# Patient Record
Sex: Male | Born: 1955 | Race: Black or African American | Hispanic: No | Marital: Married | Smoking: Never smoker
Health system: Southern US, Community
[De-identification: ages and names within clinical notes are randomized; demographics above are authoritative.]

## PROBLEM LIST (undated history)

## (undated) DIAGNOSIS — E785 Hyperlipidemia, unspecified: Secondary | ICD-10-CM

## (undated) DIAGNOSIS — G8929 Other chronic pain: Secondary | ICD-10-CM

## (undated) DIAGNOSIS — M549 Dorsalgia, unspecified: Secondary | ICD-10-CM

## (undated) HISTORY — DX: Hyperlipidemia, unspecified: E78.5

## (undated) HISTORY — PX: HERNIA REPAIR: SHX51

---

## 2015-11-15 ENCOUNTER — Ambulatory Visit (INDEPENDENT_AMBULATORY_CARE_PROVIDER_SITE_OTHER): Payer: Medicare HMO | Admitting: Internal Medicine

## 2015-11-15 ENCOUNTER — Encounter: Payer: Self-pay | Admitting: Internal Medicine

## 2015-11-15 VITALS — BP 116/78 | HR 71 | Temp 97.9°F | Ht 71.5 in | Wt 202.0 lb

## 2015-11-15 DIAGNOSIS — E785 Hyperlipidemia, unspecified: Secondary | ICD-10-CM | POA: Diagnosis not present

## 2015-11-15 LAB — LIPID PANEL
CHOL/HDL RATIO: 4
Cholesterol: 266 mg/dL — ABNORMAL HIGH (ref 0–200)
HDL: 65.2 mg/dL (ref >39.00)
LDL CALC: 189 mg/dL — AB (ref 0–99)
NONHDL: 201.15
Triglycerides: 62 mg/dL (ref 0.0–149.0)
VLDL: 12.4 mg/dL (ref 0.0–40.0)

## 2015-11-15 LAB — COMPREHENSIVE METABOLIC PANEL
ALT: 38 U/L (ref 0–53)
AST: 27 U/L (ref 0–37)
Albumin: 3.8 g/dL (ref 3.5–5.2)
Alkaline Phosphatase: 53 U/L (ref 39–117)
BUN: 17 mg/dL (ref 6–23)
CO2: 28 meq/L (ref 19–32)
Calcium: 8.9 mg/dL (ref 8.4–10.5)
Chloride: 103 mEq/L (ref 96–112)
Creatinine, Ser: 0.87 mg/dL (ref 0.40–1.50)
GFR: 116.06 mL/min (ref >60.00)
GLUCOSE: 96 mg/dL (ref 70–99)
POTASSIUM: 3.9 meq/L (ref 3.5–5.1)
Sodium: 135 mEq/L (ref 135–145)
Total Bilirubin: 0.5 mg/dL (ref 0.2–1.2)
Total Protein: 6.6 g/dL (ref 6.0–8.3)

## 2015-11-15 NOTE — Patient Instructions (Signed)

## 2015-11-15 NOTE — Progress Notes (Signed)
Pre visit review using our clinic review tool, if applicable. No additional management support is needed unless otherwise documented below in the visit note. 

## 2015-11-15 NOTE — Progress Notes (Signed)
HPI  Pt presents to the clinic today to establish care and for management of the conditions listed below. He just moved from [place].  HLD: He reports he is taking Zocor as prescribed. It was recently increased. He reports he was also on another cholesterol medication in addition to the Zocor but he can not remember the name. He does take a baby ASA daily. He does try to consume a low fat diet.  History of Stomach Ulcers: Back in the 90's. He reports he takes Advil 600 mg occasionally for back pain. He denies abdominal pain, nausea or dark stools.   Past Medical History  Diagnosis Date  . Hyperlipidemia   . Stomach ulcer     Current Outpatient Prescriptions  Medication Sig Dispense Refill  . ibuprofen (ADVIL,MOTRIN) 600 MG tablet Take 600 mg by mouth as needed.    . simvastatin (ZOCOR) 40 MG tablet Take 40 mg by mouth daily at 6 PM.     . tadalafil (CIALIS) 20 MG tablet Take 20 mg by mouth as needed for erectile dysfunction.     No current facility-administered medications for this visit.    No Known Allergies  Family History  Problem Relation Age of Onset  . COPD Father   . Cancer Paternal Grandfather     Social History   Social History  . Marital Status: Married    Spouse Name: N/A  . Number of Children: N/A  . Years of Education: N/A   Occupational History  . Not on file.   Social History Main Topics  . Smoking status: Never Smoker   . Smokeless tobacco: Never Used  . Alcohol Use: 0.0 oz/week    0 Standard drinks or equivalent per week     Comment: social  . Drug Use: No  . Sexual Activity: Yes   Other Topics Concern  . Not on file   Social History Narrative  . No narrative on file    ROS:  Constitutional: Denies fever, malaise, fatigue, headache or abrupt weight changes.  HEENT: Pt reports blurred vision. Denies eye pain, eye redness, ear pain, ringing in the ears, wax buildup, runny nose, nasal congestion, bloody nose, or sore throat. Respiratory:  Denies difficulty breathing, shortness of breath, cough or sputum production.   Cardiovascular: Denies chest pain, chest tightness, palpitations or swelling in the hands or feet.  Gastrointestinal: Denies abdominal pain, bloating, constipation, diarrhea or blood in the stool.  GU: Denies frequency, urgency, pain with urination, blood in urine, odor or discharge. Musculoskeletal: Pt reports low back pain. Denies decrease in range of motion, difficulty with gait, or joint pain and swelling.  Skin: Denies redness, rashes, lesions or ulcercations.  Neurological: Denies dizziness, difficulty with memory, difficulty with speech or problems with balance and coordination.  Psych: Denies anxiety, depression, SI/HI.  No other specific complaints in a complete review of systems (except as listed in HPI above).  PE:  BP 116/78 mmHg  Pulse 71  Temp(Src) 97.9 F (36.6 C) (Oral)  Ht 5' 11.5" (1.816 m)  Wt 202 lb (91.627 kg)  BMI 27.78 kg/m2  SpO2 97% Wt Readings from Last 3 Encounters:  11/15/15 202 lb (91.627 kg)    General: Appears his stated age, well developed, well nourished in NAD. HEENT: Head: normal shape and size; Eyes: sclera white, no icterus, conjunctiva pink, PERRLA and EOMs intact;  Cardiovascular: Normal rate and rhythm. S1,S2 noted.  No murmur, rubs or gallops noted. No JVD or BLE edema. No carotid bruits noted.  Pulmonary/Chest: Normal effort and positive vesicular breath sounds. No respiratory distress. No wheezes, rales or ronchi noted.  Abdomen: Soft and nontender. Normal bowel sounds. Musculoskeletal: Normal flexion, extension and rotation of the spine. Mild bony tenderness noted over the lumbar spine. Strength 5/5 BUE/BLE. No difficulty with gait.  Neurological: Alert and oriented.  Psychiatric: Mood and affect normal. Behavior is normal. Judgment and thought content normal.    Assessment and Plan:

## 2015-11-15 NOTE — Assessment & Plan Note (Signed)
Encouraged him to consume a low fat diet Will check CMET and Lipid Profile today Continue to consume a baby ASA

## 2015-11-17 ENCOUNTER — Telehealth: Payer: Self-pay | Admitting: Internal Medicine

## 2015-11-17 NOTE — Telephone Encounter (Signed)
Pt returned call re: lab results. Please advise

## 2015-11-17 NOTE — Telephone Encounter (Signed)
Refer to results note

## 2015-11-23 MED ORDER — SIMVASTATIN 40 MG PO TABS: 40.0000 mg | ORAL_TABLET | Freq: Every day | ORAL | Status: DC

## 2015-11-23 NOTE — Addendum Note (Signed)
Addended by: Lurlean Nanny on: 11/23/2015 08:39 AM   Modules accepted: Orders

## 2015-12-13 ENCOUNTER — Encounter (HOSPITAL_COMMUNITY): Payer: Self-pay | Admitting: *Deleted

## 2015-12-13 ENCOUNTER — Telehealth: Payer: Self-pay | Admitting: *Deleted

## 2015-12-13 ENCOUNTER — Telehealth: Payer: Self-pay | Admitting: Internal Medicine

## 2015-12-13 ENCOUNTER — Emergency Department (HOSPITAL_COMMUNITY)
Admission: EM | Admit: 2015-12-13 | Discharge: 2015-12-13 | Disposition: A | Discharge status: Home / Self Care | Payer: Medicare HMO | Attending: Emergency Medicine | Admitting: Emergency Medicine

## 2015-12-13 DIAGNOSIS — E785 Hyperlipidemia, unspecified: Secondary | ICD-10-CM | POA: Insufficient documentation

## 2015-12-13 DIAGNOSIS — M549 Dorsalgia, unspecified: Secondary | ICD-10-CM | POA: Insufficient documentation

## 2015-12-13 DIAGNOSIS — Z79899 Other long term (current) drug therapy: Secondary | ICD-10-CM | POA: Diagnosis not present

## 2015-12-13 DIAGNOSIS — G8929 Other chronic pain: Secondary | ICD-10-CM | POA: Insufficient documentation

## 2015-12-13 HISTORY — DX: Other chronic pain: G89.29

## 2015-12-13 HISTORY — DX: Dorsalgia, unspecified: M54.9

## 2015-12-13 MED ORDER — KETOROLAC TROMETHAMINE 60 MG/2ML IM SOLN
60.0000 mg | Freq: Once | INTRAMUSCULAR | Status: AC
  Administered 2015-12-13: 60 mg via INTRAMUSCULAR
  Filled 2015-12-13: qty 2

## 2015-12-13 MED ORDER — NAPROXEN 500 MG PO TABS: 500.0000 mg | ORAL_TABLET | Freq: Two times a day (BID) | ORAL | Status: DC

## 2015-12-13 MED ORDER — DIAZEPAM 5 MG/ML IJ SOLN
5.0000 mg | Freq: Once | INTRAMUSCULAR | Status: AC
  Administered 2015-12-13: 5 mg via INTRAVENOUS
  Filled 2015-12-13: qty 2

## 2015-12-13 MED ORDER — METHOCARBAMOL 500 MG PO TABS: 500.0000 mg | ORAL_TABLET | Freq: Two times a day (BID) | ORAL | Status: DC
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
## 2015-12-13 NOTE — Telephone Encounter (Signed)
Agree, ER/UC if no appt available today

## 2015-12-13 NOTE — Telephone Encounter (Signed)
Pt called via 3 way to give prior authorization for DME rolling walker.  Call ended abruptly, not sure if insurance company obtained all information.

## 2015-12-13 NOTE — ED Notes (Signed)
Declined W/C at D/C and was escorted to lobby by RN. 
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
## 2015-12-13 NOTE — Discharge Instructions (Signed)
Take the prescribed medication as directed.  This should help with your back spasms. May use wish to use heating pad, warm soaks, etc. To help. Follow-up with your primary care physician if you continue having issues. Return to the ED for new or worsening symptoms.

## 2016-03-08 ENCOUNTER — Other Ambulatory Visit: Payer: Self-pay | Admitting: Internal Medicine

## 2016-03-08 ENCOUNTER — Telehealth: Payer: Self-pay | Admitting: Internal Medicine

## 2016-03-08 MED ORDER — NAPROXEN 500 MG PO TABS: 500.0000 mg | ORAL_TABLET | Freq: Two times a day (BID) | ORAL | Status: DC

## 2016-03-08 MED ORDER — IBUPROFEN 600 MG PO TABS: 600.0000 mg | ORAL_TABLET | ORAL | Status: DC | PRN

## 2016-03-08 MED ORDER — SIMVASTATIN 40 MG PO TABS: 40.0000 mg | ORAL_TABLET | Freq: Every day | ORAL | Status: DC

## 2016-03-08 NOTE — Telephone Encounter (Addendum)
Pt called, concerned that no one had called to check on his cholesterol.  He would like a refill on  simvastatin (ZOCOR) 40 MG tablet [id number] naproxen (NAPROSYN) 500 MG tablet  ibuprofen (ADVIL,MOTRIN) 600 MG tablet [id number]   Scheduled appointment for 03/14/16 for 30 minute follow up.  Pt states he wants to have "everything" checked.  Pt wants the pharmacy changed to [facility] [address] please.

## 2016-03-08 NOTE — Telephone Encounter (Signed)
Medications sent to [facility] per request

## 2016-03-14 ENCOUNTER — Telehealth: Payer: Self-pay | Admitting: Internal Medicine

## 2016-03-14 ENCOUNTER — Encounter: Payer: Medicare HMO | Admitting: Internal Medicine

## 2016-03-14 DIAGNOSIS — Z0289 Encounter for other administrative examinations: Secondary | ICD-10-CM

## 2016-03-14 NOTE — Telephone Encounter (Signed)
Patient did not come for their scheduled appointment today forpt would like followup appointment to check everything   Please let me know if the patient needs to be contacted immediately for follow up or if no follow up is necessary.

## 2016-03-14 NOTE — Telephone Encounter (Signed)
Yes, should be rescheduled as a physical

## 2016-03-15 NOTE — Telephone Encounter (Signed)
Appointment 6/22 Pt aware

## 2016-03-15 NOTE — Telephone Encounter (Signed)
Left message asking pt to call office  °

## 2016-04-06 ENCOUNTER — Other Ambulatory Visit: Payer: Self-pay | Admitting: Internal Medicine

## 2016-04-06 ENCOUNTER — Ambulatory Visit (INDEPENDENT_AMBULATORY_CARE_PROVIDER_SITE_OTHER): Payer: Medicare HMO | Admitting: Internal Medicine

## 2016-04-06 ENCOUNTER — Encounter: Payer: Self-pay | Admitting: Internal Medicine

## 2016-04-06 VITALS — BP 122/84 | HR 76 | Temp 98.2°F | Ht 72.0 in | Wt 198.0 lb

## 2016-04-06 DIAGNOSIS — M549 Dorsalgia, unspecified: Secondary | ICD-10-CM

## 2016-04-06 DIAGNOSIS — Z1159 Encounter for screening for other viral diseases: Secondary | ICD-10-CM | POA: Diagnosis not present

## 2016-04-06 DIAGNOSIS — H6983 Other specified disorders of Eustachian tube, bilateral: Secondary | ICD-10-CM | POA: Diagnosis not present

## 2016-04-06 DIAGNOSIS — Z0000 Encounter for general adult medical examination without abnormal findings: Secondary | ICD-10-CM

## 2016-04-06 DIAGNOSIS — G8929 Other chronic pain: Secondary | ICD-10-CM | POA: Insufficient documentation

## 2016-04-06 LAB — CBC
HEMATOCRIT: 39.2 % (ref 39.0–52.0)
Hemoglobin: 12.6 g/dL — ABNORMAL LOW (ref 13.0–17.0)
MCHC: 32.2 g/dL (ref 30.0–36.0)
MCV: 86.4 fl (ref 78.0–100.0)
Platelets: 212 10*3/uL (ref 150.0–400.0)
RBC: 4.54 Mil/uL (ref 4.22–5.81)
RDW: 14.3 % (ref 11.5–15.5)
WBC: 3.7 10*3/uL — ABNORMAL LOW (ref 4.0–10.5)

## 2016-04-06 LAB — LIPID PANEL
CHOLESTEROL: 184 mg/dL (ref 0–200)
HDL: 55.8 mg/dL (ref >39.00)
LDL CALC: 119 mg/dL — AB (ref 0–99)
NONHDL: 128.57
Total CHOL/HDL Ratio: 3
Triglycerides: 47 mg/dL (ref 0.0–149.0)
VLDL: 9.4 mg/dL (ref 0.0–40.0)

## 2016-04-06 LAB — COMPREHENSIVE METABOLIC PANEL
ALT: 38 U/L (ref 0–53)
AST: 38 U/L — ABNORMAL HIGH (ref 0–37)
Albumin: 3.8 g/dL (ref 3.5–5.2)
Alkaline Phosphatase: 48 U/L (ref 39–117)
BUN: 12 mg/dL (ref 6–23)
CHLORIDE: 103 meq/L (ref 96–112)
CO2: 32 mEq/L (ref 19–32)
Calcium: 8.9 mg/dL (ref 8.4–10.5)
Creatinine, Ser: 0.88 mg/dL (ref 0.40–1.50)
GFR: 114.38 mL/min (ref >60.00)
GLUCOSE: 94 mg/dL (ref 70–99)
POTASSIUM: 4 meq/L (ref 3.5–5.1)
SODIUM: 136 meq/L (ref 135–145)
Total Bilirubin: 0.4 mg/dL (ref 0.2–1.2)
Total Protein: 6.2 g/dL (ref 6.0–8.3)

## 2016-04-06 NOTE — Progress Notes (Signed)
Pre visit review using our clinic review tool, if applicable. No additional management support is needed unless otherwise documented below in the visit note. 

## 2016-04-06 NOTE — Patient Instructions (Signed)

## 2016-04-06 NOTE — Progress Notes (Signed)
HPI:  Chris Allison presents to the clinic today for his Medicare Wellness Exam.  Past Medical History  Diagnosis Date  . Hyperlipidemia   . Chronic back pain     Current Outpatient Prescriptions  Medication Sig Dispense Refill  . ibuprofen (ADVIL,MOTRIN) 600 MG tablet Take 1 tablet (600 mg total) by mouth as needed. 30 tablet 2  . methocarbamol (ROBAXIN) 500 MG tablet Take 1 tablet (500 mg total) by mouth 2 (two) times daily. 20 tablet 0  . naproxen (NAPROSYN) 500 MG tablet Take 1 tablet (500 mg total) by mouth 2 (two) times daily with a meal. 30 tablet 2  . simvastatin (ZOCOR) 40 MG tablet Take 1 tablet (40 mg total) by mouth daily at 6 PM. 30 tablet 0  . tadalafil (CIALIS) 20 MG tablet Take 20 mg by mouth as needed for erectile dysfunction.     No current facility-administered medications for this visit.    No Known Allergies  Family History  Problem Relation Age of Onset  . COPD Father   . Cancer Paternal Grandfather     Social History   Social History  . Marital Status: Married    Spouse Name: N/A  . Number of Children: N/A  . Years of Education: N/A   Occupational History  . Not on file.   Social History Main Topics  . Smoking status: Never Smoker   . Smokeless tobacco: Never Used  . Alcohol Use: 0.0 oz/week    0 Standard drinks or equivalent per week     Comment: social  . Drug Use: No  . Sexual Activity: Yes   Other Topics Concern  . Not on file   Social History Narrative    Hospitiliaztions: None  Health Maintenance:    Flu: 06/2015  Tetanus: 11/2012  PSA: 1 year ago  Colon Screening: 2012  Eye Doctor: as needed  Dental Exam: biannually   Providers:   PCP: Webb Silversmith, NP-C    I have personally reviewed and have noted:  1. The patient's medical and social history 2. Their use of alcohol, tobacco or illicit drugs 3. Their current medications and supplements 4. The patient's functional ability including ADL's, fall risks, home safety  risks and  hearing or visual impairment. 5. Diet and physical activities 6. Evidence for depression or mood disorder  Subjective:   Review of Systems:   Constitutional: Denies fever, malaise, fatigue, headache or abrupt weight changes.  HEENT: Chris Allison reports bilateral ear fullness. Denies eye pain, eye redness, ear pain, ringing in the ears, wax buildup, runny nose, nasal congestion, bloody nose, or sore throat. Respiratory: Denies difficulty breathing, shortness of breath, cough or sputum production.   Cardiovascular: Denies chest pain, chest tightness, palpitations or swelling in the hands or feet.  Gastrointestinal: Denies abdominal pain, bloating, constipation, diarrhea or blood in the stool.  GU: Denies urgency, frequency, pain with urination, burning sensation, blood in urine, odor or discharge. Musculoskeletal: Denies decrease in range of motion, difficulty with gait, muscle pain or joint pain and swelling.  Skin: Denies redness, rashes, lesions or ulcercations.  Neurological: Denies dizziness, difficulty with memory, difficulty with speech or problems with balance and coordination.  Psych: Denies anxiety, depression, SI/HI.  No other specific complaints in a complete review of systems (except as listed in HPI above).  Objective:  PE:  BP 122/84 mmHg  Pulse 76  Temp(Src) 98.2 F (36.8 C) (Oral)  Ht 6' (1.829 m)  Wt 198 lb (89.812 kg)  BMI 26.85 kg/m2  SpO2 98%  Wt Readings from Last 3 Encounters:  04/06/16 198 lb (89.812 kg)  12/13/15 200 lb (90.719 kg)  11/15/15 202 lb (91.627 kg)    General: Appears his stated age, well developed, well nourished in NAD. Skin: Warm, dry and intact. Sebaceous cyst noted to right chin. HEENT: Head: normal shape and size; Eyes: sclera white, no icterus, conjunctiva pink, PERRLA and EOMs intact; Ears: Tm's gray and intact, normal light reflex, + serous effusion bilaterally; Throat/Mouth: Teeth present, mucosa pink and moist, no exudate, lesions or  ulcerations noted.  Neck: Neck supple, trachea midline. No masses, lumps or thyromegaly present.  Cardiovascular: Normal rate and rhythm. S1,S2 noted.  No murmur, rubs or gallops noted. No JVD or BLE edema. No carotid bruits noted. Pulmonary/Chest: Normal effort and positive vesicular breath sounds. No respiratory distress. No wheezes, rales or ronchi noted.  Abdomen: Soft and nontender. Normal bowel sounds. No distention or masses noted. Liver, spleen and kidneys non palpable. Musculoskeletal: Strength 5/5 BUE/BLE. No signs of joint swelling.  Neurological: Alert and oriented. Cranial nerves II-XII grossly intact. Coordination normal.  Psychiatric: Mood and affect normal. Behavior is normal. Judgment and thought content normal.     BMET    Component Value Date/Time   NA 135 11/15/2015 1135   K 3.9 11/15/2015 1135   CL 103 11/15/2015 1135   CO2 28 11/15/2015 1135   GLUCOSE 96 11/15/2015 1135   BUN 17 11/15/2015 1135   CREATININE 0.87 11/15/2015 1135   CALCIUM 8.9 11/15/2015 1135    Lipid Panel     Component Value Date/Time   CHOL 266* 11/15/2015 1135   TRIG 62.0 11/15/2015 1135   HDL 65.20 11/15/2015 1135   CHOLHDL 4 11/15/2015 1135   VLDL 12.4 11/15/2015 1135   LDLCALC 189* 11/15/2015 1135    CBC No results found for: WBC, RBC, HGB, HCT, PLT, MCV, MCH, MCHC, RDW, LYMPHSABS, MONOABS, EOSABS, BASOSABS  Hgb A1C No results found for: HGBA1C    Assessment and Plan:   Medicare Annual Wellness Visit:  Diet: He does eat meat. He eats veggies daily, fruits not all. He does consume fried foods. He drinks a lot of water. Physical activity: Mowing 0.5 acres 2 days week Depression/mood screen: Negative Hearing: Intact to whispered voice Visual acuity: Grossly normal, he is scheduling an eye exam ADLs: Capable Fall risk: None Home safety: Good Cognitive evaluation: Intact to orientation, naming, recall and repetition EOL planning: No adv directives, full code/ I  agree  Preventative Medicine: Flu and tetanus UTD, Encouraged him to see and eye doctor and dentist at least annually. Colonoscopy due in 5 years.   Next appointment: 1 year annual exam  ETC bilateral:  Try Flonase OTC

## 2016-04-06 NOTE — Addendum Note (Signed)
Addended by: Marchia Bond on: 04/06/2016 04:29 PM   Modules accepted: Miquel Dunn

## 2016-04-07 LAB — HEPATITIS C ANTIBODY: HCV Ab: NEGATIVE

## 2016-06-16 ENCOUNTER — Other Ambulatory Visit: Payer: Self-pay

## 2016-06-16 MED ORDER — SIMVASTATIN 40 MG PO TABS: 40.0000 mg | ORAL_TABLET | Freq: Every day | ORAL | 11 refills | Status: DC

## 2016-08-18 ENCOUNTER — Telehealth: Payer: Self-pay | Admitting: Internal Medicine

## 2016-08-18 ENCOUNTER — Ambulatory Visit: Payer: Medicare HMO | Admitting: Internal Medicine

## 2016-08-18 DIAGNOSIS — Z0289 Encounter for other administrative examinations: Secondary | ICD-10-CM

## 2016-08-18 NOTE — Telephone Encounter (Signed)
No follow up needed

## 2016-08-18 NOTE — Telephone Encounter (Signed)
Patient did not come in for their appointment today for possible kidney pain Please let me know if patient needs to be contacted immediately for follow up or no follow up needed.

## 2016-10-20 ENCOUNTER — Other Ambulatory Visit: Payer: Self-pay | Admitting: Internal Medicine

## 2017-02-27 ENCOUNTER — Other Ambulatory Visit: Payer: Self-pay | Admitting: Internal Medicine

## 2017-04-13 ENCOUNTER — Encounter: Payer: Self-pay | Admitting: Internal Medicine

## 2017-04-13 ENCOUNTER — Ambulatory Visit (INDEPENDENT_AMBULATORY_CARE_PROVIDER_SITE_OTHER): Payer: Medicare HMO | Admitting: Internal Medicine

## 2017-04-13 VITALS — BP 122/74 | HR 61 | Temp 98.2°F | Ht 71.33 in | Wt 196.5 lb

## 2017-04-13 DIAGNOSIS — Z125 Encounter for screening for malignant neoplasm of prostate: Secondary | ICD-10-CM | POA: Diagnosis not present

## 2017-04-13 DIAGNOSIS — M545 Low back pain: Secondary | ICD-10-CM

## 2017-04-13 DIAGNOSIS — G8929 Other chronic pain: Secondary | ICD-10-CM | POA: Diagnosis not present

## 2017-04-13 DIAGNOSIS — Z0000 Encounter for general adult medical examination without abnormal findings: Secondary | ICD-10-CM | POA: Diagnosis not present

## 2017-04-13 DIAGNOSIS — N3943 Post-void dribbling: Secondary | ICD-10-CM

## 2017-04-13 DIAGNOSIS — E7800 Pure hypercholesterolemia, unspecified: Secondary | ICD-10-CM | POA: Diagnosis not present

## 2017-04-13 DIAGNOSIS — N529 Male erectile dysfunction, unspecified: Secondary | ICD-10-CM | POA: Diagnosis not present

## 2017-04-13 DIAGNOSIS — N401 Enlarged prostate with lower urinary tract symptoms: Secondary | ICD-10-CM

## 2017-04-13 DIAGNOSIS — N400 Enlarged prostate without lower urinary tract symptoms: Secondary | ICD-10-CM | POA: Insufficient documentation

## 2017-04-13 LAB — TSH: TSH: 1.53 u[IU]/mL (ref 0.35–4.50)

## 2017-04-13 LAB — LIPID PANEL
Cholesterol: 224 mg/dL — ABNORMAL HIGH (ref 0–200)
HDL: 65.1 mg/dL (ref >39.00)
LDL CALC: 148 mg/dL — AB (ref 0–99)
NONHDL: 158.83
Total CHOL/HDL Ratio: 3
Triglycerides: 56 mg/dL (ref 0.0–149.0)
VLDL: 11.2 mg/dL (ref 0.0–40.0)

## 2017-04-13 LAB — COMPREHENSIVE METABOLIC PANEL
ALT: 33 U/L (ref 0–53)
AST: 34 U/L (ref 0–37)
Albumin: 4.1 g/dL (ref 3.5–5.2)
Alkaline Phosphatase: 41 U/L (ref 39–117)
BUN: 16 mg/dL (ref 6–23)
CHLORIDE: 105 meq/L (ref 96–112)
CO2: 33 mEq/L — ABNORMAL HIGH (ref 19–32)
CREATININE: 0.92 mg/dL (ref 0.40–1.50)
Calcium: 9.4 mg/dL (ref 8.4–10.5)
GFR: 108.28 mL/min (ref >60.00)
GLUCOSE: 100 mg/dL — AB (ref 70–99)
Potassium: 4.6 mEq/L (ref 3.5–5.1)
SODIUM: 140 meq/L (ref 135–145)
Total Bilirubin: 0.5 mg/dL (ref 0.2–1.2)
Total Protein: 6.6 g/dL (ref 6.0–8.3)

## 2017-04-13 LAB — CBC
HEMATOCRIT: 42 % (ref 39.0–52.0)
Hemoglobin: 13.6 g/dL (ref 13.0–17.0)
MCHC: 32.3 g/dL (ref 30.0–36.0)
MCV: 87.7 fl (ref 78.0–100.0)
Platelets: 206 10*3/uL (ref 150.0–400.0)
RBC: 4.79 Mil/uL (ref 4.22–5.81)
RDW: 13.8 % (ref 11.5–15.5)
WBC: 4.3 10*3/uL (ref 4.0–10.5)

## 2017-04-13 LAB — PSA, MEDICARE: PSA: 0.3 ng/mL (ref 0.10–4.00)

## 2017-04-13 NOTE — Assessment & Plan Note (Signed)
Encouraged him to stay active and keep his core strong Continue Advil, Naproxen and Robaxin prn

## 2017-04-13 NOTE — Assessment & Plan Note (Signed)
PSA today He declines RX for Flomax at this time

## 2017-04-13 NOTE — Assessment & Plan Note (Signed)
He questioned having his testosterone checked but declined after discussing risk of TRT Continue Cialis for now

## 2017-04-13 NOTE — Assessment & Plan Note (Signed)
Encouraged him to consume a low fat diet Continue Lipitor for now CMET and Lipid profile today

## 2017-04-13 NOTE — Patient Instructions (Signed)

## 2017-04-13 NOTE — Progress Notes (Signed)
HPI:  Pt presents to the clinic today for his Medicare Wellness Exam. He is also due to follow up chronic conditions.  Chronic Back Pain: There are no xrays or MRI's in Epic for reviewed. He reports his back pain will flare up intermittently and he takes Naproxen and Robaxin as needed with good relief.  HLD: His last LDL was 119, 03/2016. He is taking Zocor daily as prescribed. He denies myalgias. He does not consume a low fat diet.  Erectile Dysfunction: He reports low desire and thinks his testosterone may be low. He takes Cialis as needed to help with fullness of his erections.  He reports feeling "hungover" in the mornings. This occurs intermittently and usually last for 30 minutes before resolving. He denies headache, dizziness, lightheadedness or nausea. He is not sure what triggers it. He does not drink that often. He averages about 6 hours of sleep at night. He intermittently has daytime fatigue, but he does not take naps. He denies stress, anxiety or depression.  Past Medical History:  Diagnosis Date  . Chronic back pain   . Hyperlipidemia     Current Outpatient Prescriptions  Medication Sig Dispense Refill  . ibuprofen (ADVIL,MOTRIN) 600 MG tablet TAKE ONE TABLET BY MOUTH AS NEEDED 90 tablet 0  . methocarbamol (ROBAXIN) 500 MG tablet Take 1 tablet (500 mg total) by mouth 2 (two) times daily. 20 tablet 0  . naproxen (NAPROSYN) 500 MG tablet Take 1 tablet (500 mg total) by mouth 2 (two) times daily with a meal. 30 tablet 2  . simvastatin (ZOCOR) 40 MG tablet Take 1 tablet (40 mg total) by mouth daily at 6 PM. 30 tablet 11  . tadalafil (CIALIS) 20 MG tablet Take 20 mg by mouth as needed for erectile dysfunction.     No current facility-administered medications for this visit.     No Known Allergies  Family History  Problem Relation Age of Onset  . COPD Father   . Cancer Paternal Grandfather     Social History   Social History  . Marital status: Married    Spouse name:  N/A  . Number of children: N/A  . Years of education: N/A   Occupational History  . Not on file.   Social History Main Topics  . Smoking status: Never Smoker  . Smokeless tobacco: Never Used  . Alcohol use 0.0 oz/week     Comment: social  . Drug use: No  . Sexual activity: Yes   Other Topics Concern  . Not on file   Social History Narrative  . No narrative on file    Hospitiliaztions: None  Health Maintenance:    Flu: 06/2016  Tetanus: 11/2012  PSA: unsure  Colon Screening: 2012  Eye Doctor: as needed  Dental Exam: biannually   Providers:   PCP: Webb Silversmith, NP-C   I have personally reviewed and have noted:  1. The patient's medical and social history 2. Their use of alcohol, tobacco or illicit drugs 3. Their current medications and supplements 4. The patient's functional ability including ADL's, fall risks, home safety risks and hearing or visual impairment. 5. Diet and physical activities 6. Evidence for depression or mood disorder  Subjective:   Review of Systems:   Constitutional: Denies fever, malaise, fatigue, headache or abrupt weight changes.  HEENT: Denies eye pain, eye redness, ear pain, ringing in the ears, wax buildup, runny nose, nasal congestion, bloody nose, or sore throat. Respiratory: Denies difficulty breathing, shortness of breath, cough or sputum  production.   Cardiovascular: Denies chest pain, chest tightness, palpitations or swelling in the hands or feet.  Gastrointestinal: Denies abdominal pain, bloating, constipation, diarrhea or blood in the stool.  GU: Pt reports erectile dysfunction and low desire. Denies urgency, frequency, pain with urination, burning sensation, blood in urine, odor or discharge. Musculoskeletal: Pt reports intermittent low back pain. Denies decrease in range of motion, difficulty with gait, or joint swelling.  Skin: Pt concerned about mole on back. Denies redness, rashes, or ulcercations.  Neurological: Denies  dizziness, difficulty with memory, difficulty with speech or problems with balance and coordination.  Psych: Denies anxiety, depression, SI/HI.  No other specific complaints in a complete review of systems (except as listed in HPI above).  Objective:  PE:   BP 122/74   Pulse 61   Temp 98.2 F (36.8 C) (Oral)   Ht 5' 11.33" (1.812 m)   Wt 196 lb 8 oz (89.1 kg)   SpO2 98%   BMI 27.15 kg/m   Wt Readings from Last 3 Encounters:  04/06/16 198 lb (89.8 kg)  12/13/15 200 lb (90.7 kg)  11/15/15 202 lb (91.6 kg)    General: Appears his stated age, well developed, well nourished in NAD. Skin: Warm, dry and intact. Mole,  < 1 cm, irregularly shaped but uniform in color and consistency noted on midline upper back. Sebaceous cyst noted on back. HEENT: Head: normal shape and size; Eyes: sclera white, no icterus, conjunctiva pink, PERRLA and EOMs intact; Ears: Tm's gray and intact, normal light reflex; Throat/Mouth: Teeth present, mucosa pink and moist, + PND, no exudate, lesions or ulcerations noted.  Neck: Neck supple, trachea midline. No masses, lumps or thyromegaly present.  Cardiovascular: Normal rate and rhythm. S1,S2 noted.  No murmur, rubs or gallops noted. No JVD or BLE edema. No carotid bruits noted. Pulmonary/Chest: Normal effort and positive vesicular breath sounds. No respiratory distress. No wheezes, rales or ronchi noted.  Abdomen: Soft and nontender. Normal bowel sounds. No distention or masses noted. Liver, spleen and kidneys non palpable. Rectal: Normal tone, slightly enlarged prostate noted. Musculoskeletal: Strength 5/5 BUE/BLE. No signs of joint swelling.  Neurological: Alert and oriented. Cranial nerves II-XII grossly intact. No difficulty with gait. Psychiatric: Mood and affect normal. Behavior is normal. Judgment and thought content normal.    BMET    Component Value Date/Time   NA 136 04/06/2016 1504   K 4.0 04/06/2016 1504   CL 103 04/06/2016 1504   CO2 32  04/06/2016 1504   GLUCOSE 94 04/06/2016 1504   BUN 12 04/06/2016 1504   CREATININE 0.88 04/06/2016 1504   CALCIUM 8.9 04/06/2016 1504    Lipid Panel     Component Value Date/Time   CHOL 184 04/06/2016 1504   TRIG 47.0 04/06/2016 1504   HDL 55.80 04/06/2016 1504   CHOLHDL 3 04/06/2016 1504   VLDL 9.4 04/06/2016 1504   LDLCALC 119 (H) 04/06/2016 1504    CBC    Component Value Date/Time   WBC 3.7 (L) 04/06/2016 1504   RBC 4.54 04/06/2016 1504   HGB 12.6 (L) 04/06/2016 1504   HCT 39.2 04/06/2016 1504   PLT 212.0 04/06/2016 1504   MCV 86.4 04/06/2016 1504   MCHC 32.2 04/06/2016 1504   RDW 14.3 04/06/2016 1504    Hgb A1C No results found for: HGBA1C    Assessment and Plan:   Medicare Annual Wellness Visit:  Diet: He does eat meat. He consumes fruits and veggies daily. He does occasionally eat fried  foods. He drinks mostly water. Physical activity: He gets regular activity about 3 days a week. Depression/mood screen: Negative Hearing: Intact to whispered voice Visual acuity: Grossly normal, having some issues reading, wears readers ADLs: Capable Fall risk: None Home safety: Good Cognitive evaluation: Intact to orientation, naming, recall and repetition EOL planning: No adv directives, full code/ I agree  Preventative Medicine: Encouraged him to get a flu shot in the fall. Tetanus UTD. Colon screening UTD per his report but will request record. Encouraged him to consume a balanced diet and exercise regimen. Advised him to see an eye doctor and dentist at least annually. Will check CBC, CMET, Lipid, TSH, and PSA today.   Next appointment: 1 year, Medicare Wellness Exam   Webb Silversmith, NP

## 2017-04-16 ENCOUNTER — Telehealth: Payer: Self-pay | Admitting: Internal Medicine

## 2017-04-16 MED ORDER — SIMVASTATIN 80 MG PO TABS: 80.0000 mg | ORAL_TABLET | Freq: Every day | ORAL | 3 refills | Status: DC

## 2017-04-16 NOTE — Telephone Encounter (Signed)
Patient returned Melanie's call.  Patient said he is taking his cholesterol medication 90% of the time.  It's okay to increase the dose.  Patient uses [facility].  Patient said Threasa Beards can call him back tomorrow morning.

## 2017-04-16 NOTE — Telephone Encounter (Signed)
Rx sent to pharmacy   

## 2017-04-16 NOTE — Addendum Note (Signed)
Addended by: Lurlean Nanny on: 04/16/2017 04:18 PM   Modules accepted: Orders

## 2017-06-25 DIAGNOSIS — F330 Major depressive disorder, recurrent, mild: Secondary | ICD-10-CM | POA: Diagnosis not present

## 2017-07-05 DIAGNOSIS — F330 Major depressive disorder, recurrent, mild: Secondary | ICD-10-CM | POA: Diagnosis not present

## 2017-07-20 DIAGNOSIS — F330 Major depressive disorder, recurrent, mild: Secondary | ICD-10-CM | POA: Diagnosis not present

## 2017-08-09 ENCOUNTER — Other Ambulatory Visit: Payer: Self-pay | Admitting: Internal Medicine

## 2017-08-09 NOTE — Telephone Encounter (Signed)
Last refill 02/27/17 #90  Last OV 04/13/17  Ok to refill?

## 2017-10-15 ENCOUNTER — Ambulatory Visit: Payer: Self-pay | Admitting: *Deleted

## 2017-10-15 NOTE — Telephone Encounter (Signed)
Called in c/o right sided abd pain from under ribs to hip.  It really hurts bad when I lay on my right side.   It doesn't hurt while I'm up and moving around.   He requested his appt be made on Friday due to his work schedule.   I instructed him to go to the urgent care or ED if his pain became worse over the holiday (New Year's; the office is closed).    He verbalized understanding.  Reason for Disposition . [1] MODERATE pain (e.g., interferes with normal activities) AND [2] pain comes and goes (cramps) AND [3] present > 24 hours  (Exception: pain with Vomiting or Diarrhea - see that Guideline)  Answer Assessment - Initial Assessment Questions 1. LOCATION: "Where does it hurt?"      Hurting on right side under my ribs for a couple of weeks now. 2. RADIATION: "Does the pain shoot anywhere else?" (e.g., chest, back)     It's not going anywhere else. 3. ONSET: "When did the pain begin?" (Minutes, hours or days ago)      Couple of weeks now 4. SUDDEN: "Gradual or sudden onset?"  Gradually then became worse.     Constant ache.  When I'm up it doesn't hurt at all but when I lay down on my right side it hurts really bad. 5. PATTERN "Does the pain come and go, or is it constant?"    - If constant: "Is it getting better, staying the same, or worsening?"      (Note: Constant means the pain never goes away completely; most serious pain is constant and it progresses)     - If intermittent: "How long does it last?" "Do you have pain now?"     (Note: Intermittent means the pain goes away completely between bouts)     Getting worse with time. 6. SEVERITY: "How bad is the pain?"  (e.g., Scale 1-10; mild, moderate, or severe)    - MILD (1-3): doesn't interfere with normal activities, abdomen soft and not tender to touch     - MODERATE (4-7): interferes with normal activities or awakens from sleep, tender to touch     - SEVERE (8-10): excruciating pain, doubled over, unable to do any normal activities    10 when laying on my right side. 7. RECURRENT SYMPTOM: "Have you ever had this type of abdominal pain before?" If so, ask: "When was the last time?" and "What happened that time?"      I took some Ibuprofen and it helps a lot.   8. CAUSE: "What do you think is causing the abdominal pain?"     I don't know.   Feels like a really bad pulled muscle. 9. RELIEVING/AGGRAVATING FACTORS: "What makes it better or worse?" (e.g., movement, antacids, bowel movement)     No 10. OTHER SYMPTOMS: "Has there been any vomiting, diarrhea, constipation, or urine problems?"       No  Protocols used: ABDOMINAL PAIN - MALE-A-AH

## 2017-10-19 ENCOUNTER — Ambulatory Visit: Payer: Medicare HMO | Admitting: Internal Medicine

## 2017-10-19 DIAGNOSIS — Z0289 Encounter for other administrative examinations: Secondary | ICD-10-CM

## 2017-11-09 ENCOUNTER — Ambulatory Visit (INDEPENDENT_AMBULATORY_CARE_PROVIDER_SITE_OTHER): Payer: Medicare HMO | Admitting: Internal Medicine

## 2017-11-09 ENCOUNTER — Ambulatory Visit (INDEPENDENT_AMBULATORY_CARE_PROVIDER_SITE_OTHER)
Admission: RE | Admit: 2017-11-09 | Discharge: 2017-11-09 | Disposition: A | Discharge status: Home / Self Care | Payer: Medicare HMO | Source: Ambulatory Visit | Attending: Internal Medicine | Admitting: Internal Medicine

## 2017-11-09 ENCOUNTER — Encounter: Payer: Self-pay | Admitting: Internal Medicine

## 2017-11-09 VITALS — BP 120/82 | HR 102 | Temp 98.2°F | Wt 201.0 lb

## 2017-11-09 DIAGNOSIS — R0781 Pleurodynia: Secondary | ICD-10-CM

## 2017-11-09 DIAGNOSIS — R6882 Decreased libido: Secondary | ICD-10-CM

## 2017-11-09 DIAGNOSIS — M25561 Pain in right knee: Secondary | ICD-10-CM | POA: Diagnosis not present

## 2017-11-09 DIAGNOSIS — R1011 Right upper quadrant pain: Secondary | ICD-10-CM

## 2017-11-09 LAB — COMPREHENSIVE METABOLIC PANEL
ALBUMIN: 3.8 g/dL (ref 3.5–5.2)
ALT: 60 U/L — ABNORMAL HIGH (ref 0–53)
AST: 66 U/L — ABNORMAL HIGH (ref 0–37)
Alkaline Phosphatase: 51 U/L (ref 39–117)
BUN: 17 mg/dL (ref 6–23)
CALCIUM: 9 mg/dL (ref 8.4–10.5)
CHLORIDE: 103 meq/L (ref 96–112)
CO2: 30 mEq/L (ref 19–32)
CREATININE: 0.85 mg/dL (ref 0.40–1.50)
GFR: 118.4 mL/min (ref >60.00)
Glucose, Bld: 112 mg/dL — ABNORMAL HIGH (ref 70–99)
POTASSIUM: 4 meq/L (ref 3.5–5.1)
Sodium: 136 mEq/L (ref 135–145)
Total Bilirubin: 0.5 mg/dL (ref 0.2–1.2)
Total Protein: 6.7 g/dL (ref 6.0–8.3)

## 2017-11-09 LAB — CBC
HEMATOCRIT: 41.3 % (ref 39.0–52.0)
HEMOGLOBIN: 13.6 g/dL (ref 13.0–17.0)
MCHC: 32.9 g/dL (ref 30.0–36.0)
MCV: 86.4 fl (ref 78.0–100.0)
PLATELETS: 194 10*3/uL (ref 150.0–400.0)
RBC: 4.78 Mil/uL (ref 4.22–5.81)
RDW: 13.7 % (ref 11.5–15.5)
WBC: 3.5 10*3/uL — AB (ref 4.0–10.5)

## 2017-11-09 NOTE — Patient Instructions (Signed)
Chest Wall Pain °Chest wall pain is pain in or around the bones and muscles of your chest. Sometimes, an injury causes this pain. Sometimes, the cause may not be known. This pain may take several weeks or longer to get better. °Follow these instructions at home: °Pay attention to any changes in your symptoms. Take these actions to help with your pain: °· Rest as told by your doctor. °· Avoid activities that cause pain. Try not to use your chest, belly (abdominal), or side muscles to lift heavy things. °· If directed, apply ice to the painful area: °? Put ice in a plastic bag. °? Place a towel between your skin and the bag. °? Leave the ice on for 20 minutes, 2-3 times per day. °· Take over-the-counter and prescription medicines only as told by your doctor. °· Do not use tobacco products, including cigarettes, chewing tobacco, and e-cigarettes. If you need help quitting, ask your doctor. °· Keep all follow-up visits as told by your doctor. This is important. ° °Contact a doctor if: °· You have a fever. °· Your chest pain gets worse. °· You have new symptoms. °Get help right away if: °· You feel sick to your stomach (nauseous) or you throw up (vomit). °· You feel sweaty or light-headed. °· You have a cough with phlegm (sputum) or you cough up blood. °· You are short of breath. °This information is not intended to replace advice given to you by your health care provider. Make sure you discuss any questions you have with your health care provider. °Document Released: 03/20/2008 Document Revised: 03/09/2016 Document Reviewed: 12/28/2014 °Elsevier Interactive Patient Education © 2018 [employer] ° °

## 2017-11-09 NOTE — Progress Notes (Signed)
Subjective:    Patient ID: Chris Allison, male    DOB: 05/09/1956, 62 y.o.   MRN: [id number]  HPI  Pt presents to the clinic today with right side rib pain and right upper quadrant pain. He reports this started 1 month ago. The pain is intermittent. He describes the pain as tight. The pain does not radiate. It only occurs while he is laying in the bed on his right side. Getting out of the bed makes the pain better. He does not have it when he is sitting or standing. He denies nausea, vomiting, diarrhea, or urinary complaints. He has had some mild constipation but nothing consistent. He denies blood in his stool. He has taken Ibuprofen with some relief.   He also reports decreased sexual drive and would like his testosterone checked. He denies unwielding stress in his life.  Review of Systems      Past Medical History:  Diagnosis Date  . Chronic back pain   . Hyperlipidemia     Current Outpatient Medications  Medication Sig Dispense Refill  . ibuprofen (ADVIL,MOTRIN) 600 MG tablet TAKE 1 TABLET BY MOUTH AS NEEDED 90 tablet 0  . methocarbamol (ROBAXIN) 500 MG tablet Take 1 tablet (500 mg total) by mouth 2 (two) times daily. 20 tablet 0  . naproxen (NAPROSYN) 500 MG tablet Take 1 tablet (500 mg total) by mouth 2 (two) times daily with a meal. 30 tablet 2  . simvastatin (ZOCOR) 80 MG tablet Take 1 tablet (80 mg total) by mouth daily. 30 tablet 3  . tadalafil (CIALIS) 20 MG tablet Take 20 mg by mouth as needed for erectile dysfunction.     No current facility-administered medications for this visit.     No Known Allergies  Family History  Problem Relation Age of Onset  . COPD Father   . Cancer Paternal Grandfather     Social History   Socioeconomic History  . Marital status: Married    Spouse name: Not on file  . Number of children: Not on file  . Years of education: Not on file  . Highest education level: Not on file  Social Needs  . Financial resource strain: Not on  file  . Food insecurity - worry: Not on file  . Food insecurity - inability: Not on file  . Transportation needs - medical: Not on file  . Transportation needs - non-medical: Not on file  Occupational History  . Not on file  Tobacco Use  . Smoking status: Never Smoker  . Smokeless tobacco: Never Used  Substance and Sexual Activity  . Alcohol use: Yes    Alcohol/week: 0.0 oz    Comment: social  . Drug use: No  . Sexual activity: Yes  Other Topics Concern  . Not on file  Social History Narrative  . Not on file     Constitutional: Denies fever, malaise, fatigue, headache or abrupt weight changes.  Respiratory: Denies difficulty breathing, shortness of breath, cough or sputum production.   Cardiovascular: Denies chest pain, chest tightness, palpitations or swelling in the hands or feet.  Gastrointestinal: Pt reports RUQ pain, intermittent constipation. Denies bloating, diarrhea or blood in the stool.  GU: Pt reports decreased sexual desire. Denies urgency, frequency, pain with urination, burning sensation, blood in urine, odor or discharge. Musculoskeletal: Pt reports right side rib pain. Denies decrease in range of motion, difficulty with gait,  or joint pain and swelling.    No other specific complaints in a complete review  of systems (except as listed in HPI above).  Objective:   Physical Exam   BP 120/82   Pulse (!) 102   Temp 98.2 F (36.8 C) (Oral)   Wt 201 lb (91.2 kg)   SpO2 98%   BMI 27.78 kg/m  Wt Readings from Last 3 Encounters:  11/09/17 201 lb (91.2 kg)  04/13/17 196 lb 8 oz (89.1 kg)  04/06/16 198 lb (89.8 kg)    General: Appears his stated age, well developed, well nourished in NAD. Skin: Warm, dry and intact. No rashes noted. Cardiovascular: Normal rate and rhythm.  Pulmonary/Chest: Normal effort and positive vesicular breath sounds. No respiratory distress. No wheezes, rales or ronchi noted.  Abdomen: Soft and nontender. Normal bowel sounds. No  distention or masses noted.  Musculoskeletal: Right side chest wall nontender to palpation.   BMET    Component Value Date/Time   NA 140 04/13/2017 1512   K 4.6 04/13/2017 1512   CL 105 04/13/2017 1512   CO2 33 (H) 04/13/2017 1512   GLUCOSE 100 (H) 04/13/2017 1512   BUN 16 04/13/2017 1512   CREATININE 0.92 04/13/2017 1512   CALCIUM 9.4 04/13/2017 1512    Lipid Panel     Component Value Date/Time   CHOL 224 (H) 04/13/2017 1512   TRIG 56.0 04/13/2017 1512   HDL 65.10 04/13/2017 1512   CHOLHDL 3 04/13/2017 1512   VLDL 11.2 04/13/2017 1512   LDLCALC 148 (H) 04/13/2017 1512    CBC    Component Value Date/Time   WBC 4.3 04/13/2017 1512   RBC 4.79 04/13/2017 1512   HGB 13.6 04/13/2017 1512   HCT 42.0 04/13/2017 1512   PLT 206.0 04/13/2017 1512   MCV 87.7 04/13/2017 1512   MCHC 32.3 04/13/2017 1512   RDW 13.8 04/13/2017 1512    Hgb A1C No results found for: HGBA1C         Assessment & Plan:   Right Side Rib Pain, RUQ Pain:  Xray right side ribs today CBC, CMET today  Decreased Sexual Desire:  Testosterone- make a lab only appt  Will follow up after labs, return precautions discussed Webb Silversmith, NP

## 2017-11-16 ENCOUNTER — Other Ambulatory Visit: Payer: Medicare HMO

## 2017-11-16 ENCOUNTER — Other Ambulatory Visit: Payer: Self-pay | Admitting: Internal Medicine

## 2017-11-16 DIAGNOSIS — R748 Abnormal levels of other serum enzymes: Secondary | ICD-10-CM

## 2017-11-23 ENCOUNTER — Other Ambulatory Visit: Payer: Medicare HMO

## 2017-11-30 ENCOUNTER — Other Ambulatory Visit (INDEPENDENT_AMBULATORY_CARE_PROVIDER_SITE_OTHER): Payer: Medicare HMO

## 2017-11-30 DIAGNOSIS — R6882 Decreased libido: Secondary | ICD-10-CM

## 2017-11-30 LAB — TESTOSTERONE: Testosterone: 151.79 ng/dL — ABNORMAL LOW (ref 300.00–890.00)

## 2017-12-05 ENCOUNTER — Other Ambulatory Visit: Payer: Self-pay | Admitting: Internal Medicine

## 2017-12-05 DIAGNOSIS — R7989 Other specified abnormal findings of blood chemistry: Secondary | ICD-10-CM

## 2017-12-07 ENCOUNTER — Ambulatory Visit: Payer: Medicare HMO | Admitting: Internal Medicine

## 2017-12-07 ENCOUNTER — Ambulatory Visit
Admission: RE | Admit: 2017-12-07 | Discharge: 2017-12-07 | Disposition: A | Discharge status: Home / Self Care | Payer: Medicare HMO | Source: Ambulatory Visit | Attending: Internal Medicine | Admitting: Internal Medicine

## 2017-12-07 DIAGNOSIS — K7689 Other specified diseases of liver: Secondary | ICD-10-CM | POA: Diagnosis not present

## 2017-12-07 DIAGNOSIS — R748 Abnormal levels of other serum enzymes: Secondary | ICD-10-CM

## 2017-12-10 ENCOUNTER — Other Ambulatory Visit: Payer: Self-pay | Admitting: Internal Medicine

## 2017-12-10 DIAGNOSIS — K769 Liver disease, unspecified: Secondary | ICD-10-CM

## 2017-12-11 ENCOUNTER — Other Ambulatory Visit: Payer: Self-pay | Admitting: Internal Medicine

## 2017-12-11 DIAGNOSIS — K769 Liver disease, unspecified: Secondary | ICD-10-CM

## 2017-12-21 ENCOUNTER — Ambulatory Visit (HOSPITAL_COMMUNITY): Payer: Medicare HMO

## 2017-12-22 ENCOUNTER — Ambulatory Visit (HOSPITAL_COMMUNITY)
Admission: RE | Admit: 2017-12-22 | Discharge: 2017-12-22 | Disposition: A | Discharge status: Home / Self Care | Payer: Medicare HMO | Source: Ambulatory Visit | Attending: Internal Medicine | Admitting: Internal Medicine

## 2017-12-22 DIAGNOSIS — R7989 Other specified abnormal findings of blood chemistry: Secondary | ICD-10-CM | POA: Insufficient documentation

## 2017-12-22 DIAGNOSIS — K769 Liver disease, unspecified: Secondary | ICD-10-CM | POA: Diagnosis not present

## 2017-12-22 MED ORDER — GADOBENATE DIMEGLUMINE 529 MG/ML IV SOLN
20.0000 mL | Freq: Once | INTRAVENOUS | Status: AC
  Administered 2017-12-22: 20 mL via INTRAVENOUS

## 2018-01-04 ENCOUNTER — Ambulatory Visit (HOSPITAL_COMMUNITY): Payer: Medicare HMO

## 2018-01-11 ENCOUNTER — Other Ambulatory Visit: Payer: Self-pay | Admitting: Internal Medicine

## 2018-02-15 ENCOUNTER — Other Ambulatory Visit: Payer: Self-pay | Admitting: Internal Medicine

## 2018-03-22 ENCOUNTER — Encounter (HOSPITAL_COMMUNITY): Payer: Self-pay | Admitting: *Deleted

## 2018-03-22 ENCOUNTER — Emergency Department (HOSPITAL_COMMUNITY)
Admission: EM | Admit: 2018-03-22 | Discharge: 2018-03-22 | Disposition: A | Discharge status: Home / Self Care | Payer: Medicare HMO | Attending: Emergency Medicine | Admitting: Emergency Medicine

## 2018-03-22 ENCOUNTER — Other Ambulatory Visit: Payer: Self-pay

## 2018-03-22 DIAGNOSIS — L089 Local infection of the skin and subcutaneous tissue, unspecified: Secondary | ICD-10-CM | POA: Diagnosis not present

## 2018-03-22 DIAGNOSIS — Y999 Unspecified external cause status: Secondary | ICD-10-CM | POA: Diagnosis not present

## 2018-03-22 DIAGNOSIS — S70362A Insect bite (nonvenomous), left thigh, initial encounter: Secondary | ICD-10-CM | POA: Insufficient documentation

## 2018-03-22 DIAGNOSIS — W57XXXA Bitten or stung by nonvenomous insect and other nonvenomous arthropods, initial encounter: Secondary | ICD-10-CM | POA: Diagnosis not present

## 2018-03-22 DIAGNOSIS — Y929 Unspecified place or not applicable: Secondary | ICD-10-CM | POA: Diagnosis not present

## 2018-03-22 DIAGNOSIS — Y939 Activity, unspecified: Secondary | ICD-10-CM | POA: Insufficient documentation

## 2018-03-22 DIAGNOSIS — S79922A Unspecified injury of left thigh, initial encounter: Secondary | ICD-10-CM | POA: Diagnosis present

## 2018-03-22 DIAGNOSIS — R21 Rash and other nonspecific skin eruption: Secondary | ICD-10-CM | POA: Diagnosis not present

## 2018-03-22 DIAGNOSIS — Z79899 Other long term (current) drug therapy: Secondary | ICD-10-CM | POA: Diagnosis not present

## 2018-03-22 MED ORDER — DOXYCYCLINE HYCLATE 100 MG PO CAPS: 100.0000 mg | ORAL_CAPSULE | Freq: Two times a day (BID) | ORAL | 0 refills | Status: DC

## 2018-03-22 NOTE — ED Provider Notes (Signed)
[facility] EMERGENCY DEPARTMENT Provider Note   CSN: [id number] Arrival date & time: 03/22/18  1548     History   Chief Complaint Chief Complaint  Patient presents with  . Insect Bite    HPI Chris Allison is a 62 y.o. male.  HPI   62 year old male presenting for evaluation of insect bite.  Patient noticed 2 spots on his left thigh approximately 5 days ago which has since increasing size, became more painful and itchy.  Anything he could have been bitten by some kind of insect.  He did not notice any tick bite.  No associated fever, headache, joint pain, numbness stiffness.  No specific treatment tried at home.  No one else with similar symptoms.  No recent travel.  Past Medical History:  Diagnosis Date  . Chronic back pain   . Hyperlipidemia     Patient Active Problem List   Diagnosis Date Noted  . Erectile dysfunction 04/13/2017  . BPH (benign prostatic hyperplasia) 04/13/2017  . Chronic back pain 04/06/2016  . HLD (hyperlipidemia) 11/15/2015    Past Surgical History:  Procedure Laterality Date  . HERNIA REPAIR     inguinal        Home Medications    Prior to Admission medications   Medication Sig Start Date End Date Taking? Authorizing Provider  ibuprofen (ADVIL,MOTRIN) 600 MG tablet TAKE 1 TABLET BY MOUTH AS NEEDED 02/18/18   Jearld Fenton, NP  methocarbamol (ROBAXIN) 500 MG tablet Take 1 tablet (500 mg total) by mouth 2 (two) times daily. 12/13/15   Larene Pickett, PA-C  naproxen (NAPROSYN) 500 MG tablet Take 1 tablet (500 mg total) by mouth 2 (two) times daily with a meal. 03/08/16   Jearld Fenton, NP  simvastatin (ZOCOR) 80 MG tablet TAKE 1 TABLET BY MOUTH ONCE DAILY 01/13/18   Jearld Fenton, NP  tadalafil (CIALIS) 20 MG tablet Take 20 mg by mouth as needed for erectile dysfunction.    [provider]    Family History Family History  Problem Relation Age of Onset  . COPD Father   . Cancer Paternal Grandfather      Social History Social History   Tobacco Use  . Smoking status: Never Smoker  . Smokeless tobacco: Never Used  Substance Use Topics  . Alcohol use: Yes    Alcohol/week: 0.0 oz    Comment: social  . Drug use: No     Allergies   Patient has no known allergies.   Review of Systems Review of Systems  Constitutional: Negative for fever.  Skin: Positive for rash.     Physical Exam Updated Vital Signs BP 118/77   Pulse 85   Temp 98.1 F (36.7 C)   Resp 18   Ht 6' (1.829 m)   Wt 89.4 kg (197 lb)   SpO2 96%   BMI 26.72 kg/m   Physical Exam  Constitutional: He appears well-developed and well-nourished. No distress.  HENT:  Head: Atraumatic.  Eyes: Conjunctivae are normal.  Neck: Neck supple.  Neurological: He is alert.  Skin: Rash (Left thigh: In the anterior thigh there are 2 circumscribed macular lesion approximately 3 cm in diameter with central clearing and mildly indurated without fluctuance.  It is tender to palpation.  No lymphangitis, no pustular, vesicular, or petechial lesi) noted.  Psychiatric: He has a normal mood and affect.  Nursing note and vitals reviewed.    ED Treatments / Results  Labs (all labs ordered are  listed, but only abnormal results are displayed) Labs Reviewed - No data to display  EKG None  Radiology No results found.  Procedures Procedures (including critical care time)  Medications Ordered in ED Medications - No data to display   Initial Impression / Assessment and Plan / ED Course  I have reviewed the triage vital signs and the nursing notes.  Pertinent labs & imaging results that were available during my care of the patient were reviewed by me and considered in my medical decision making (see chart for details).     BP 118/77   Pulse 85   Temp 98.1 F (36.7 C)   Resp 18   Ht 6' (1.829 m)   Wt 89.4 kg (197 lb)   SpO2 96%   BMI 26.72 kg/m    Final Clinical Impressions(s) / ED Diagnoses   Final  diagnoses:  Bug bite with infection, initial encounter    ED Discharge Orders        Ordered    doxycycline (VIBRAMYCIN) 100 MG capsule  2 times daily     03/22/18 1908     7:07 PM Patient here with discomfort to his left thigh from insect bite.  It appears to be localized skin irritation with surrounding cellulitic skin changes concerning for cellulitis.  No systemic manifestation.  Patient will benefit from antibiotic.  Will prescribe doxycycline.  Encourage Neosporin cream as well.  Return precautions discussed.   Domenic Moras, PA-C 03/22/18 1909    Tegeler, Gwenyth Allegra, MD 03/23/18 [phone]

## 2018-03-22 NOTE — ED Triage Notes (Signed)
The pt has 2 bug bite on his lt upper thigh 5 days ago.  He did not have pain initially  Painful for 3 days  No temp tired

## 2018-07-26 ENCOUNTER — Encounter: Payer: Medicare HMO | Admitting: Internal Medicine

## 2018-07-26 NOTE — Progress Notes (Deleted)
HPI:  Pt presents to the clinic today for his Medicare Wellness Exam. He is also due to follow up chronic conditions.  HLD:  Chronic Back Pain:  BPH with ED:  Past Medical History:  Diagnosis Date  . Chronic back pain   . Hyperlipidemia     Current Outpatient Medications  Medication Sig Dispense Refill  . doxycycline (VIBRAMYCIN) 100 MG capsule Take 1 capsule (100 mg total) by mouth 2 (two) times daily. One po bid x 7 days 14 capsule 0  . ibuprofen (ADVIL,MOTRIN) 600 MG tablet TAKE 1 TABLET BY MOUTH AS NEEDED 90 tablet 0  . methocarbamol (ROBAXIN) 500 MG tablet Take 1 tablet (500 mg total) by mouth 2 (two) times daily. 20 tablet 0  . naproxen (NAPROSYN) 500 MG tablet Take 1 tablet (500 mg total) by mouth 2 (two) times daily with a meal. 30 tablet 2  . simvastatin (ZOCOR) 80 MG tablet TAKE 1 TABLET BY MOUTH ONCE DAILY 90 tablet 1  . tadalafil (CIALIS) 20 MG tablet Take 20 mg by mouth as needed for erectile dysfunction.     No current facility-administered medications for this visit.     No Known Allergies  Family History  Problem Relation Age of Onset  . COPD Father   . Cancer Paternal Grandfather     Social History   Socioeconomic History  . Marital status: Married    Spouse name: Not on file  . Number of children: Not on file  . Years of education: Not on file  . Highest education level: Not on file  Occupational History  . Not on file  Social Needs  . Financial resource strain: Not on file  . Food insecurity:    Worry: Not on file    Inability: Not on file  . Transportation needs:    Medical: Not on file    Non-medical: Not on file  Tobacco Use  . Smoking status: Never Smoker  . Smokeless tobacco: Never Used  Substance and Sexual Activity  . Alcohol use: Yes    Alcohol/week: 0.0 standard drinks    Comment: social  . Drug use: No  . Sexual activity: Yes  Lifestyle  . Physical activity:    Days per week: Not on file    Minutes per session: Not on file   . Stress: Not on file  Relationships  . Social connections:    Talks on phone: Not on file    Gets together: Not on file    Attends religious service: Not on file    Active member of club or organization: Not on file    Attends meetings of clubs or organizations: Not on file    Relationship status: Not on file  . Intimate partner violence:    Fear of current or ex partner: Not on file    Emotionally abused: Not on file    Physically abused: Not on file    Forced sexual activity: Not on file  Other Topics Concern  . Not on file  Social History Narrative  . Not on file    Hospitiliaztions:  Health Maintenance:    Flu:  Tetanus:  Pneumovax:  Prevnar:  Zostavax:  Mammogram:  Pap Smear:  PSA:  Bone Density:  Colon Screening:  Eye Doctor:  Dental Exam:   Providers:   PCP:  Dermatologist:  Cardiologist:  ENT:  Gastroenterologist:  Pulmonologist:   I have personally reviewed and have noted:  1. The patient's medical and social history 2. Their  use of alcohol, tobacco or illicit drugs 3. Their current medications and supplements 4. The patient's functional ability including ADL's, fall risks, home safety risks and  hearing or visual impairment. 5. Diet and physical activities 6. Evidence for depression or mood disorder  Subjective:   Review of Systems:   Constitutional: Denies fever, malaise, fatigue, headache or abrupt weight changes.  HEENT: Denies eye pain, eye redness, ear pain, ringing in the ears, wax buildup, runny nose, nasal congestion, bloody nose, or sore throat. Respiratory: Denies difficulty breathing, shortness of breath, cough or sputum production.   Cardiovascular: Denies chest pain, chest tightness, palpitations or swelling in the hands or feet.  Gastrointestinal: Denies abdominal pain, bloating, constipation, diarrhea or blood in the stool.  GU: Denies urgency, frequency, pain with urination, burning sensation, blood in urine, odor or  discharge. Musculoskeletal: Denies decrease in range of motion, difficulty with gait, muscle pain or joint pain and swelling.  Skin: Denies redness, rashes, lesions or ulcercations.  Neurological: Denies dizziness, difficulty with memory, difficulty with speech or problems with balance and coordination.  Psych: Denies anxiety, depression, SI/HI.  No other specific complaints in a complete review of systems (except as listed in HPI above).  Objective:  PE:   There were no vitals taken for this visit. Wt Readings from Last 3 Encounters:  03/22/18 197 lb (89.4 kg)  11/09/17 201 lb (91.2 kg)  04/13/17 196 lb 8 oz (89.1 kg)    General: Appears their stated age, well developed, well nourished in NAD. Skin: Warm, dry and intact. No rashes, lesions or ulcerations noted. HEENT: Head: normal shape and size; Eyes: sclera white, no icterus, conjunctiva pink, PERRLA and EOMs intact; Ears: Tm's gray and intact, normal light reflex; Throat/Mouth: Teeth present, mucosa pink and moist, no exudate, lesions or ulcerations noted.  Neck: Neck supple, trachea midline. No masses, lumps or thyromegaly present.  Cardiovascular: Normal rate and rhythm. S1,S2 noted.  No murmur, rubs or gallops noted. No JVD or BLE edema. No carotid bruits noted. Pulmonary/Chest: Normal effort and positive vesicular breath sounds. No respiratory distress. No wheezes, rales or ronchi noted.  Abdomen: Soft and nontender. Normal bowel sounds. No distention or masses noted. Liver, spleen and kidneys non palpable. Musculoskeletal: Normal range of motion. Strength 5/5 BUE/BLE. No signs of joint swelling.  Neurological: Alert and oriented. Cranial nerves II-XII grossly intact. Coordination normal.  Psychiatric: Mood and affect normal. Behavior is normal. Judgment and thought content normal.   EKG:  BMET    Component Value Date/Time   NA 136 11/09/2017 1431   K 4.0 11/09/2017 1431   CL 103 11/09/2017 1431   CO2 30 11/09/2017 1431    GLUCOSE 112 (H) 11/09/2017 1431   BUN 17 11/09/2017 1431   CREATININE 0.85 11/09/2017 1431   CALCIUM 9.0 11/09/2017 1431    Lipid Panel     Component Value Date/Time   CHOL 224 (H) 04/13/2017 1512   TRIG 56.0 04/13/2017 1512   HDL 65.10 04/13/2017 1512   CHOLHDL 3 04/13/2017 1512   VLDL 11.2 04/13/2017 1512   LDLCALC 148 (H) 04/13/2017 1512    CBC    Component Value Date/Time   WBC 3.5 (L) 11/09/2017 1431   RBC 4.78 11/09/2017 1431   HGB 13.6 11/09/2017 1431   HCT 41.3 11/09/2017 1431   PLT 194.0 11/09/2017 1431   MCV 86.4 11/09/2017 1431   MCHC 32.9 11/09/2017 1431   RDW 13.7 11/09/2017 1431    Hgb A1C No results found for:  HGBA1C    Assessment and Plan:   Medicare Annual Wellness Visit:  Diet: Heart healthy or DM if diabetic Physical activity: Sedentary Depression/mood screen: Negative Hearing: Intact to whispered voice Visual acuity: Grossly normal, performs annual eye exam  ADLs: Capable Fall risk: None Home safety: Good Cognitive evaluation: Intact to orientation, naming, recall and repetition EOL planning: Adv directives, full code/ I agree  Preventative Medicine:   Next appointment:   Webb Silversmith, NP

## 2018-08-02 ENCOUNTER — Other Ambulatory Visit: Payer: Self-pay | Admitting: Internal Medicine

## 2018-08-02 MED ORDER — IBUPROFEN 600 MG PO TABS: 600.0000 mg | ORAL_TABLET | ORAL | 0 refills | Status: DC | PRN

## 2018-08-02 NOTE — Telephone Encounter (Signed)
Will route to office for final disposition. 

## 2018-08-02 NOTE — Telephone Encounter (Signed)
Copied from [place] [id number]. Topic: Quick Communication - Rx Refill/Question >> Aug 02, 2018 12:55 PM Blase Mess A wrote: Medication: ibuprofen (ADVIL,MOTRIN) 600 MG tablet [[id number]]   Has the patient contacted their pharmacy? Yes  (Agent: If no, request that the patient contact the pharmacy for the refill.) (Agent: If yes, when and what did the pharmacy advise?)  Preferred Pharmacy (with phone number or street name): [place], [place] - [address] [place] [address] Phone: [phone] Fax: [phone]    Agent: Please be advised that RX refills may take up to 3 business days. We ask that you follow-up with your pharmacy.

## 2018-09-27 ENCOUNTER — Encounter: Payer: Self-pay | Admitting: Internal Medicine

## 2018-09-27 ENCOUNTER — Ambulatory Visit (INDEPENDENT_AMBULATORY_CARE_PROVIDER_SITE_OTHER): Payer: Medicare HMO | Admitting: Internal Medicine

## 2018-09-27 VITALS — BP 124/78 | HR 70 | Temp 97.9°F | Ht 71.33 in | Wt 203.0 lb

## 2018-09-27 DIAGNOSIS — E7800 Pure hypercholesterolemia, unspecified: Secondary | ICD-10-CM | POA: Diagnosis not present

## 2018-09-27 DIAGNOSIS — G8929 Other chronic pain: Secondary | ICD-10-CM | POA: Diagnosis not present

## 2018-09-27 DIAGNOSIS — Z125 Encounter for screening for malignant neoplasm of prostate: Secondary | ICD-10-CM | POA: Diagnosis not present

## 2018-09-27 DIAGNOSIS — Z0000 Encounter for general adult medical examination without abnormal findings: Secondary | ICD-10-CM | POA: Diagnosis not present

## 2018-09-27 DIAGNOSIS — N529 Male erectile dysfunction, unspecified: Secondary | ICD-10-CM

## 2018-09-27 DIAGNOSIS — M545 Low back pain, unspecified: Secondary | ICD-10-CM

## 2018-09-27 NOTE — Assessment & Plan Note (Signed)
Encouraged him to stretch daily Advised core strengthening to reduce strain on back Continue Naproxen and Robaxin prn

## 2018-09-27 NOTE — Progress Notes (Signed)
HPI:  Pt presents to the clinic today for his Medicare Wellness Exam. He is also due to follow up chronic conditions.  Chronic Back Pain: Flares intermittently. He takes Naproxen and Robaxin as needed with good relief. There are no xrays or MRI for review in Epic.  HLD: His last LDL was 148, 03/2017. He denies myalgias on Simvastatin. He does consume a low fat diet.  Erectile Dysfunction: He reports decreased sex drive and a reduction in the fullness of his erection. He takes Cialis as needed.  Past Medical History:  Diagnosis Date  . Chronic back pain   . Hyperlipidemia     Current Outpatient Medications  Medication Sig Dispense Refill  . doxycycline (VIBRAMYCIN) 100 MG capsule Take 1 capsule (100 mg total) by mouth 2 (two) times daily. One po bid x 7 days 14 capsule 0  . ibuprofen (ADVIL,MOTRIN) 600 MG tablet Take 1 tablet (600 mg total) by mouth as needed. 90 tablet 0  . methocarbamol (ROBAXIN) 500 MG tablet Take 1 tablet (500 mg total) by mouth 2 (two) times daily. 20 tablet 0  . naproxen (NAPROSYN) 500 MG tablet Take 1 tablet (500 mg total) by mouth 2 (two) times daily with a meal. 30 tablet 2  . simvastatin (ZOCOR) 80 MG tablet TAKE 1 TABLET BY MOUTH ONCE DAILY 90 tablet 1  . tadalafil (CIALIS) 20 MG tablet Take 20 mg by mouth as needed for erectile dysfunction.     No current facility-administered medications for this visit.     No Known Allergies  Family History  Problem Relation Age of Onset  . COPD Father   . Cancer Paternal Grandfather     Social History   Socioeconomic History  . Marital status: Married    Spouse name: Not on file  . Number of children: Not on file  . Years of education: Not on file  . Highest education level: Not on file  Occupational History  . Not on file  Social Needs  . Financial resource strain: Not on file  . Food insecurity:    Worry: Not on file    Inability: Not on file  . Transportation needs:    Medical: Not on file     Non-medical: Not on file  Tobacco Use  . Smoking status: Never Smoker  . Smokeless tobacco: Never Used  Substance and Sexual Activity  . Alcohol use: Yes    Alcohol/week: 0.0 standard drinks    Comment: social  . Drug use: No  . Sexual activity: Yes  Lifestyle  . Physical activity:    Days per week: Not on file    Minutes per session: Not on file  . Stress: Not on file  Relationships  . Social connections:    Talks on phone: Not on file    Gets together: Not on file    Attends religious service: Not on file    Active member of club or organization: Not on file    Attends meetings of clubs or organizations: Not on file    Relationship status: Not on file  . Intimate partner violence:    Fear of current or ex partner: Not on file    Emotionally abused: Not on file    Physically abused: Not on file    Forced sexual activity: Not on file  Other Topics Concern  . Not on file  Social History Narrative  . Not on file    Hospitiliaztions: None  Health Maintenance:    Flu:  07/2018  Tetanus: 11/2012  Zostavax: None  Shingrix: None  PSA: 03/2017  Colon Screening: 2012, [place] Doctor: as needed  Dental Exam: biannually   Providers:   PCP: Webb Silversmith, NP-C   I have personally reviewed and have noted:  1. The patient's medical and social history 2. Their use of alcohol, tobacco or illicit drugs 3. Their current medications and supplements 4. The patient's functional ability including ADL's, fall risks, home safety risks and hearing or visual impairment. 5. Diet and physical activities 6. Evidence for depression or mood disorder  Subjective:   Review of Systems:   Constitutional: Denies fever, malaise, fatigue, headache or abrupt weight changes.  HEENT: Denies eye pain, eye redness, ear pain, ringing in the ears, wax buildup, runny nose, nasal congestion, bloody nose, or sore throat. Respiratory: Denies difficulty breathing, shortness of breath, cough or  sputum production.   Cardiovascular: Denies chest pain, chest tightness, palpitations or swelling in the hands or feet.  Gastrointestinal: Pt reports intermittent reflux. Denies abdominal pain, bloating, constipation, diarrhea or blood in the stool.  GU: Denies urgency, frequency, pain with urination, burning sensation, blood in urine, odor or discharge. Musculoskeletal: Pt reports intermittent back pain. Denies decrease in range of motion, difficulty with gait, or joint swelling.  Skin: Denies redness, rashes, lesions or ulcercations.  Neurological: Denies dizziness, difficulty with memory, difficulty with speech or problems with balance and coordination.  Psych: Denies anxiety, depression, SI/HI.  No other specific complaints in a complete review of systems (except as listed in HPI above).  Objective:  PE:   BP 124/78   Pulse 70   Temp 97.9 F (36.6 C) (Oral)   Ht 5' 11.33" (1.812 m)   Wt 203 lb (92.1 kg)   SpO2 98%   BMI 28.05 kg/m   Wt Readings from Last 3 Encounters:  03/22/18 197 lb (89.4 kg)  11/09/17 201 lb (91.2 kg)  04/13/17 196 lb 8 oz (89.1 kg)    General: Appears his stated age, well developed, well nourished in NAD. Skin: Warm, dry and intact.  HEENT: Head: normal shape and size; Eyes: sclera white, no icterus, conjunctiva pink, PERRLA and EOMs intact; Ears: Tm's gray and intact, normal light reflex; Throat/Mouth: Teeth present, mucosa pink and moist, no exudate, lesions or ulcerations noted.  Neck: Neck supple, trachea midline. No masses, lumps or thyromegaly present.  Cardiovascular: Normal rate and rhythm. S1,S2 noted.  No murmur, rubs or gallops noted. No JVD or BLE edema. No carotid bruits noted. Pulmonary/Chest: Normal effort and positive vesicular breath sounds. No respiratory distress. No wheezes, rales or ronchi noted.  Abdomen: Soft and nontender. Normal bowel sounds. No distention or masses noted. Liver, spleen and kidneys non palpable. Musculoskeletal:  Strength 5/5 BUE/BLE. No signs of joint swelling.  Neurological: Alert and oriented. Cranial nerves II-XII grossly intact. Coordination normal.  Psychiatric: Mood and affect normal. Behavior is normal. Judgment and thought content normal.    BMET    Component Value Date/Time   NA 136 11/09/2017 1431   K 4.0 11/09/2017 1431   CL 103 11/09/2017 1431   CO2 30 11/09/2017 1431   GLUCOSE 112 (H) 11/09/2017 1431   BUN 17 11/09/2017 1431   CREATININE 0.85 11/09/2017 1431   CALCIUM 9.0 11/09/2017 1431    Lipid Panel     Component Value Date/Time   CHOL 224 (H) 04/13/2017 1512   TRIG 56.0 04/13/2017 1512   HDL 65.10 04/13/2017 1512   CHOLHDL 3 04/13/2017 1512  VLDL 11.2 04/13/2017 1512   LDLCALC 148 (H) 04/13/2017 1512    CBC    Component Value Date/Time   WBC 3.5 (L) 11/09/2017 1431   RBC 4.78 11/09/2017 1431   HGB 13.6 11/09/2017 1431   HCT 41.3 11/09/2017 1431   PLT 194.0 11/09/2017 1431   MCV 86.4 11/09/2017 1431   MCHC 32.9 11/09/2017 1431   RDW 13.7 11/09/2017 1431    Hgb A1C No results found for: HGBA1C    Assessment and Plan:   Medicare Annual Wellness Visit:  Diet: He does eat meat. He consumes fruits and veggies daily. He tries to avoid fried foods. He drinks mostly water. Physical activity: None Depression/mood screen: Negative Hearing: Intact to whispered voice Visual acuity: Grossly normal ADLs: Capable Fall risk: None Home safety: Good Cognitive evaluation: Intact to orientation, naming, recall and repetition EOL planning: No adv directives, full code/ I agree  Preventative Medicine: Flu and tetanus UTD. He will get Shingrix at the pharmacy. PSA today with labs. Will request records for colon cancer screening. Encouraged him to consume a balanced diet and exercise regimen. Advised him to see an eye doctor and dentist annually. Will check CBC, CMET, Lipid an PSA today.   Next appointment: 1 year, Medicare Wellness Exam   Webb Silversmith, NP

## 2018-09-27 NOTE — Assessment & Plan Note (Signed)
Continue Cialis prn

## 2018-09-27 NOTE — Assessment & Plan Note (Signed)
CBC, CMET and Lipid profile today Encouraged him to consume a low fat diet Continue Simvastatin for now

## 2018-09-27 NOTE — Patient Instructions (Signed)

## 2018-09-28 LAB — CBC
HCT: 40.8 % (ref 38.5–50.0)
Hemoglobin: 13.5 g/dL (ref 13.2–17.1)
MCH: 28.4 pg (ref 27.0–33.0)
MCHC: 33.1 g/dL (ref 32.0–36.0)
MCV: 85.9 fL (ref 80.0–100.0)
MPV: 10.8 fL (ref 7.5–12.5)
Platelets: 231 10*3/uL (ref 140–400)
RBC: 4.75 10*6/uL (ref 4.20–5.80)
RDW: 13.1 % (ref 11.0–15.0)
WBC: 4.6 10*3/uL (ref 3.8–10.8)

## 2018-09-28 LAB — COMPREHENSIVE METABOLIC PANEL
AG Ratio: 1.5 (calc) (ref 1.0–2.5)
ALT: 27 U/L (ref 9–46)
AST: 21 U/L (ref 10–35)
Albumin: 4.1 g/dL (ref 3.6–5.1)
Alkaline phosphatase (APISO): 58 U/L (ref 40–115)
BILIRUBIN TOTAL: 0.4 mg/dL (ref 0.2–1.2)
BUN: 21 mg/dL (ref 7–25)
CALCIUM: 9.2 mg/dL (ref 8.6–10.3)
CO2: 25 mmol/L (ref 20–32)
Chloride: 102 mmol/L (ref 98–110)
Creat: 0.88 mg/dL (ref 0.70–1.25)
Globulin: 2.7 g/dL (calc) (ref 1.9–3.7)
Glucose, Bld: 93 mg/dL (ref 65–99)
Potassium: 4.4 mmol/L (ref 3.5–5.3)
Sodium: 135 mmol/L (ref 135–146)
Total Protein: 6.8 g/dL (ref 6.1–8.1)

## 2018-09-28 LAB — LIPID PANEL
Cholesterol: 255 mg/dL — ABNORMAL HIGH (ref <200)
HDL: 65 mg/dL (ref >40)
LDL Cholesterol (Calc): 164 mg/dL (calc) — ABNORMAL HIGH
Non-HDL Cholesterol (Calc): 190 mg/dL (calc) — ABNORMAL HIGH (ref <130)
Total CHOL/HDL Ratio: 3.9 (calc) (ref <5.0)
Triglycerides: 127 mg/dL (ref <150)

## 2018-09-28 LAB — PSA: PSA: 0.2 ng/mL (ref <=4.0)

## 2018-11-26 ENCOUNTER — Telehealth: Payer: Self-pay

## 2018-11-26 NOTE — Telephone Encounter (Signed)
Pt has pain in lt and rt ribs when deep breathing and wheezes and SOB when goes up and down steps, H/A, and non prod cough, body aches,hoarseness, some runny nose, no S/T now. Pt has not taken temp but feels hot. Pt has been sick since 11/21/18. Pt scheduled appt with Dr Einar Pheasant on 11/26/18 at 8 AM. (pt will come 10' early to get checked in). If  Pt condition worsens prior to appt pt will go to [facility]. FYI to Dr Einar Pheasant.

## 2018-11-27 ENCOUNTER — Ambulatory Visit (INDEPENDENT_AMBULATORY_CARE_PROVIDER_SITE_OTHER)
Admission: RE | Admit: 2018-11-27 | Discharge: 2018-11-27 | Disposition: A | Discharge status: Home / Self Care | Payer: Medicare HMO | Source: Ambulatory Visit | Attending: Family Medicine | Admitting: Family Medicine

## 2018-11-27 ENCOUNTER — Ambulatory Visit (INDEPENDENT_AMBULATORY_CARE_PROVIDER_SITE_OTHER): Payer: Medicare HMO | Admitting: Family Medicine

## 2018-11-27 ENCOUNTER — Encounter: Payer: Self-pay | Admitting: Family Medicine

## 2018-11-27 ENCOUNTER — Other Ambulatory Visit: Payer: Self-pay

## 2018-11-27 VITALS — BP 108/72 | HR 75 | Temp 98.6°F | Ht 71.33 in | Wt 204.2 lb

## 2018-11-27 DIAGNOSIS — J0110 Acute frontal sinusitis, unspecified: Secondary | ICD-10-CM

## 2018-11-27 DIAGNOSIS — R0602 Shortness of breath: Secondary | ICD-10-CM | POA: Diagnosis not present

## 2018-11-27 MED ORDER — SIMVASTATIN 80 MG PO TABS: 80.0000 mg | ORAL_TABLET | Freq: Every day | ORAL | 1 refills | Status: DC

## 2018-11-27 MED ORDER — AMOXICILLIN-POT CLAVULANATE 875-125 MG PO TABS: 1.0000 | ORAL_TABLET | Freq: Two times a day (BID) | ORAL | 0 refills | Status: AC

## 2018-11-27 MED ORDER — IBUPROFEN 600 MG PO TABS: 600.0000 mg | ORAL_TABLET | ORAL | 1 refills | Status: AC | PRN

## 2018-11-27 NOTE — Progress Notes (Signed)
Subjective:     Chris Allison is a 63 y.o. male presenting for URI (symptoms started on 11/21/2018. Feeling better tha he did this past weekend. Symptoms are wheezing, SOB, headache, non productive cough, body aches, sweats, runny nose, hoarse, sore throat, pain in ribs area with breathing. Has taking Nyquil and Dayquil.) and Hands and feet curl up (on their own. Has been doing for a while. )     URI   This is a new problem. The current episode started in the past 7 days. The problem has been gradually improving. Associated symptoms include abdominal pain, chest pain, congestion, coughing, headaches, rhinorrhea, sinus pain, a sore throat and wheezing. Pertinent negatives include no ear pain, nausea, plugged ear sensation or vomiting. He has tried decongestant and antihistamine for the symptoms. The treatment provided mild relief.   Sick contact - last Wednesday  Symptoms started Thursday - in the bed all weekend  Review of Systems  Constitutional: Positive for chills, diaphoresis and fever.  HENT: Positive for congestion, dental problem (pain), rhinorrhea, sinus pain, sore throat and voice change (hoarse). Negative for ear pain.   Respiratory: Positive for cough, shortness of breath and wheezing.   Cardiovascular: Positive for chest pain.  Gastrointestinal: Positive for abdominal pain. Negative for nausea and vomiting.  Neurological: Positive for headaches.     Social History   Tobacco Use  Smoking Status Never Smoker  Smokeless Tobacco Never Used        Objective:    BP Readings from Last 3 Encounters:  11/27/18 108/72  09/27/18 124/78  03/22/18 122/90   Wt Readings from Last 3 Encounters:  11/27/18 204 lb 4 oz (92.6 kg)  09/27/18 203 lb (92.1 kg)  03/22/18 197 lb (89.4 kg)    BP 108/72   Pulse 75   Temp 98.6 F (37 C)   Ht 5' 11.33" (1.812 m)   Wt 204 lb 4 oz (92.6 kg)   SpO2 97%   BMI 28.22 kg/m    Physical Exam Constitutional:      General: He is not in  acute distress.    Appearance: He is well-developed. He is not ill-appearing.  HENT:     Head: Normocephalic and atraumatic.     Right Ear: Tympanic membrane and ear canal normal.     Left Ear: Tympanic membrane and ear canal normal.     Nose: Mucosal edema and rhinorrhea present.     Right Sinus: Frontal sinus tenderness present. No maxillary sinus tenderness.     Left Sinus: Frontal sinus tenderness present. No maxillary sinus tenderness.     Mouth/Throat:     Pharynx: Uvula midline. Posterior oropharyngeal erythema present. No oropharyngeal exudate.     Tonsils: Swelling: 0 on the right. 0 on the left.  Neck:     Musculoskeletal: Neck supple.  Cardiovascular:     Rate and Rhythm: Normal rate and regular rhythm.     Heart sounds: No murmur.  Pulmonary:     Effort: Pulmonary effort is normal. No respiratory distress.     Breath sounds: Decreased breath sounds present. No wheezing or rhonchi.  Lymphadenopathy:     Cervical: No cervical adenopathy.  Skin:    General: Skin is warm and dry.     Capillary Refill: Capillary refill takes less than 2 seconds.  Neurological:     Mental Status: He is alert.    CXR: Clear lungs on my read       Assessment & Plan:  Problem List Items Addressed This Visit    None    Visit Diagnoses    Acute non-recurrent frontal sinusitis    -  Primary   Relevant Medications   amoxicillin-clavulanate (AUGMENTIN) 875-125 MG tablet   Shortness of breath       Relevant Orders   DG Chest 2 View     CXR reviewed which does not show pneumonia. Will follow-up final read  Suspect possible sinus infection given HA and duration of symptoms.  Symptomatic care - including saline rinse discussed   Return if symptoms worsen or fail to improve.  Lesleigh Noe, MD

## 2018-11-27 NOTE — Patient Instructions (Addendum)
Schedule chest XR for later this morning   Based on your symptoms, it looks like you have a sinus infection.   Antibiotics are not need for a viral infection but the following will help:   1. Drink plenty of fluids 2. Get lots of rest  Sinus Congestion 1) Neti Pot (Saline rinse) -- 2 times day -- if tolerated 2) Flonase (Store Brand ok) - once daily 3) Over the counter congestion medications  Cough 1) Cough drops can be helpful 2) Nyquil (or nighttime cough medication) 3) Honey is proven to be one of the best cough medications   Sore Throat 1) Honey as above, cough drops 2) Ibuprofen or Aleve can be helpful 3) Salt water Gargles  If you develop fevers (Temperature >100.4), chills, worsening symptoms or symptoms lasting longer than 10 days return to clinic.

## 2018-12-24 ENCOUNTER — Other Ambulatory Visit: Payer: Self-pay | Admitting: Internal Medicine

## 2018-12-24 DIAGNOSIS — E7800 Pure hypercholesterolemia, unspecified: Secondary | ICD-10-CM

## 2018-12-26 ENCOUNTER — Other Ambulatory Visit: Payer: Self-pay

## 2018-12-26 MED ORDER — SIMVASTATIN 80 MG PO TABS: 80.0000 mg | ORAL_TABLET | Freq: Every day | ORAL | 1 refills | Status: DC

## 2018-12-29 IMAGING — US US ABDOMEN LIMITED
1 series · 14 of 25 positions shown · non-contrast
Comparison: None.

CLINICAL DATA: Elevated liver function tests.

EXAM:
ULTRASOUND ABDOMEN LIMITED RIGHT UPPER QUADRANT

[Series 1: us abdomen limited · 0.28mm/px · 14 of 66 slices shown]
[im 1/66]
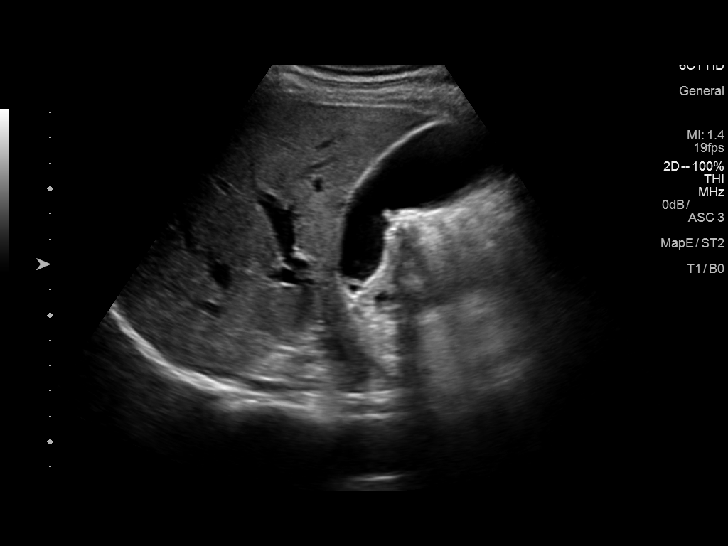
[im 6/66]
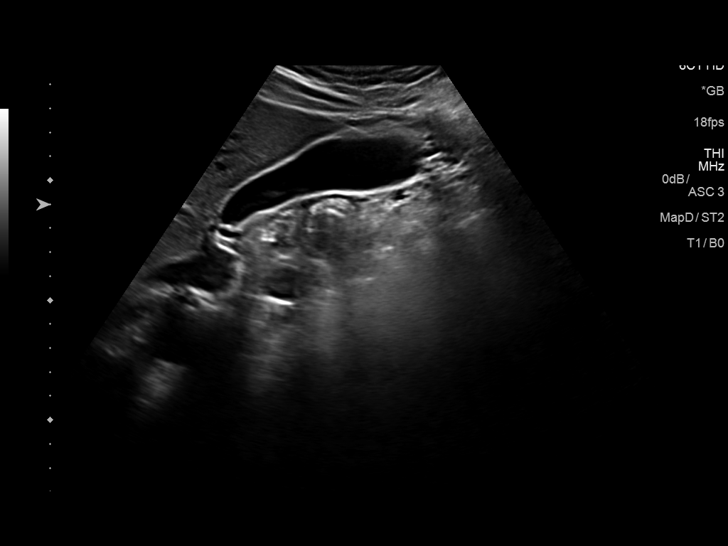
[im 11/66]
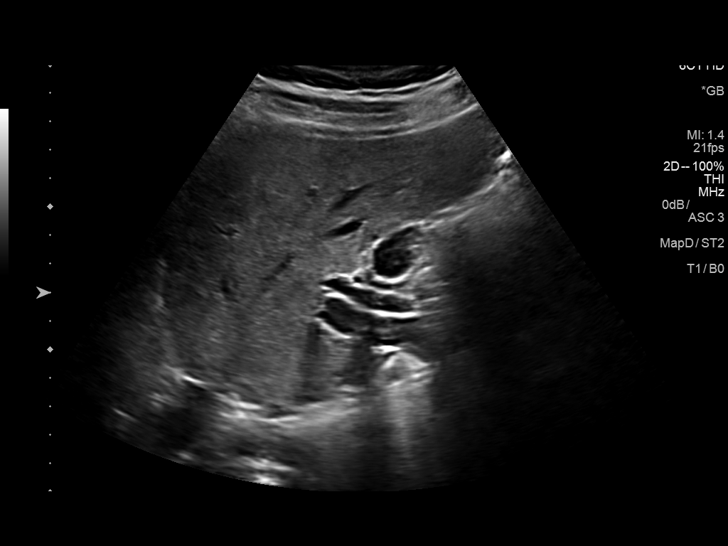
[im 17/66]
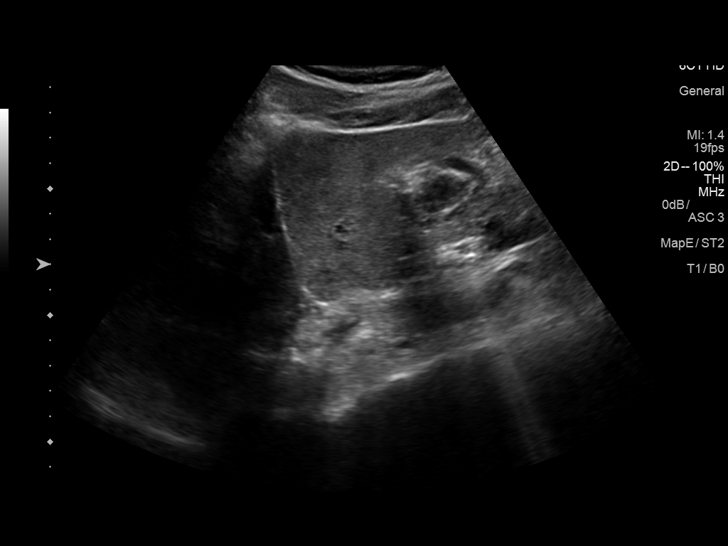
[im 22/66]
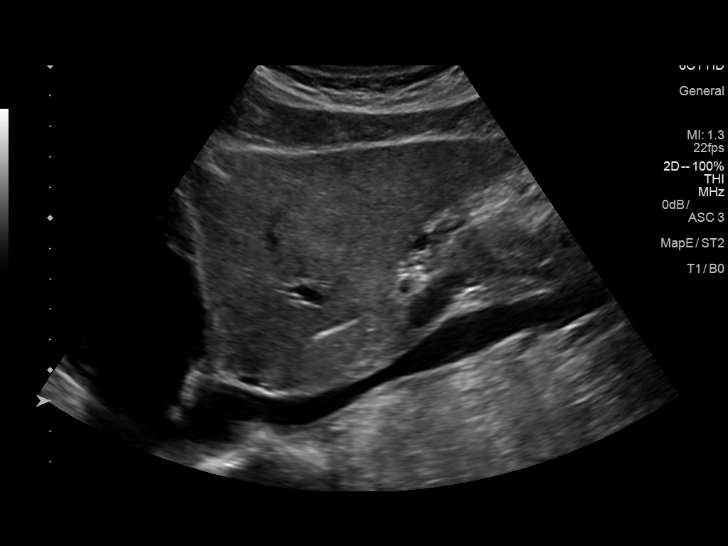
[im 25/66]
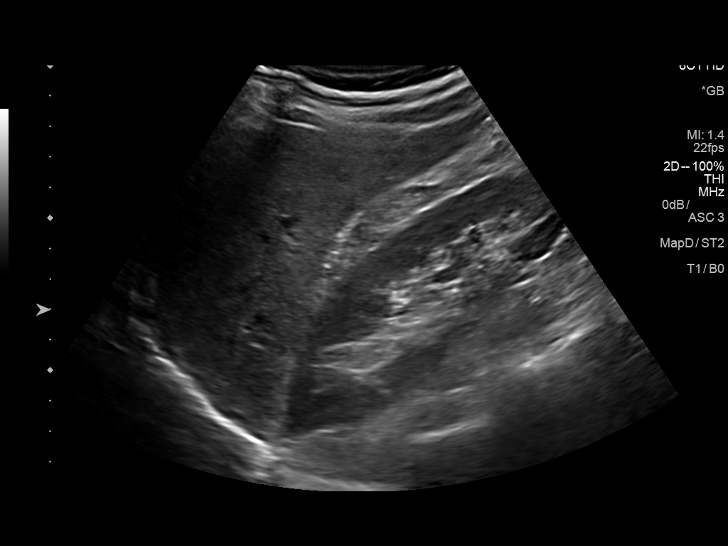
[im 30/66]
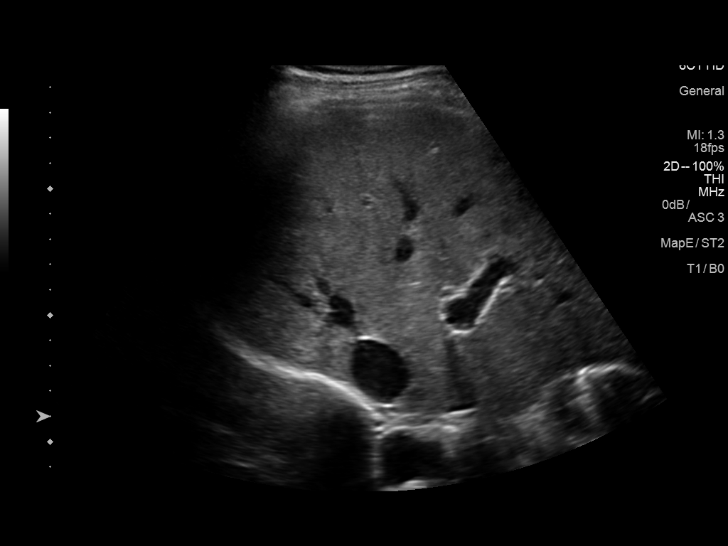
[im 36/66]
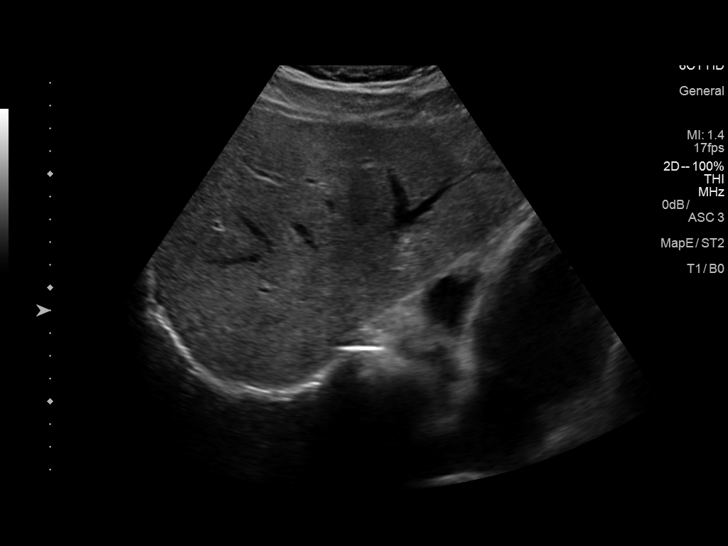
[im 41/66]
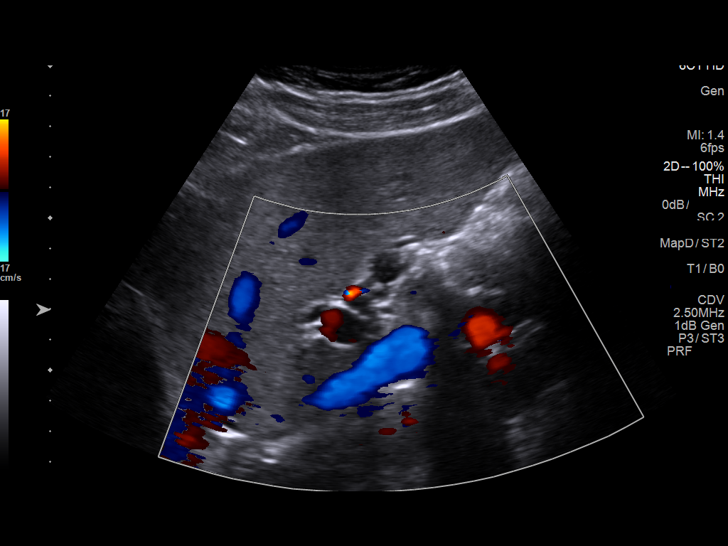
[im 44/66]
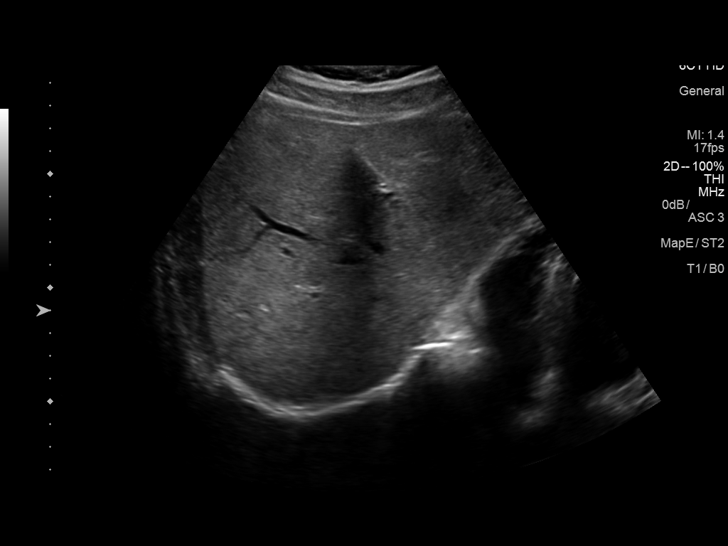
[im 49/66]
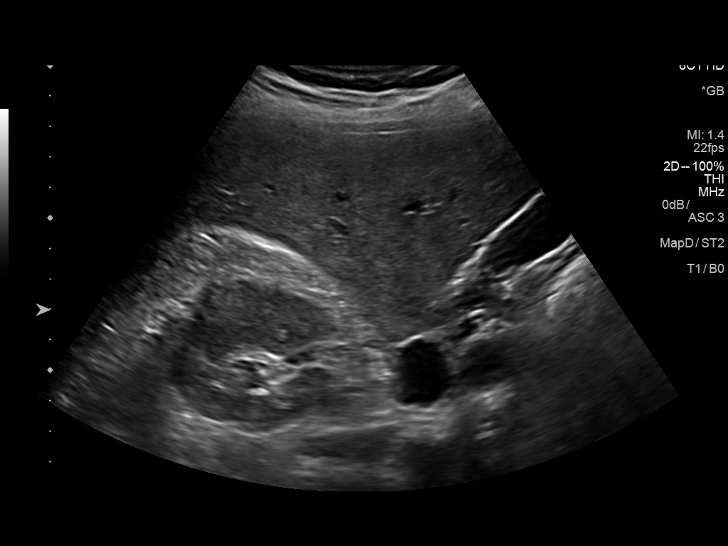
[im 55/66]
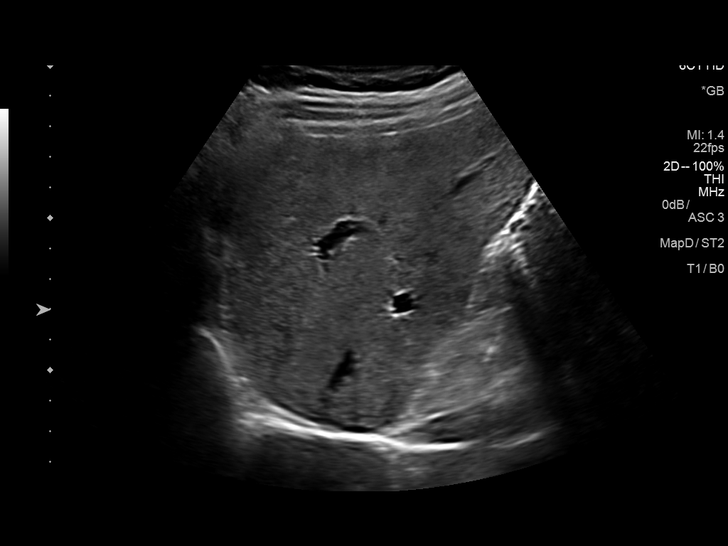
[im 60/66]
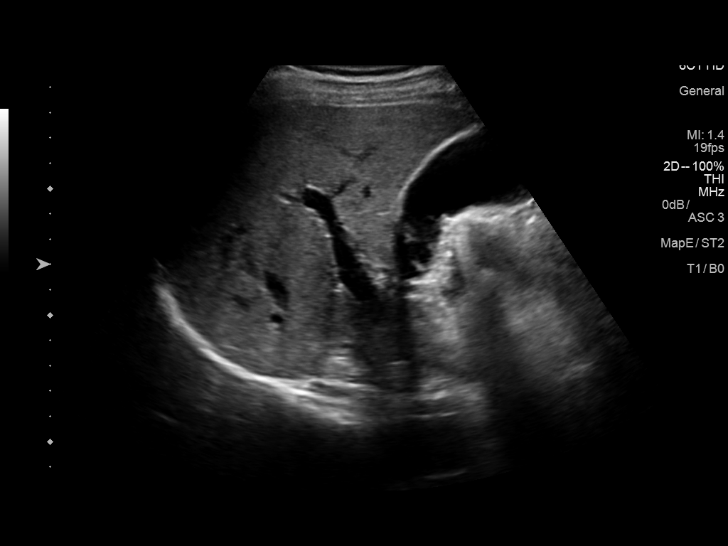
[im 66/66]
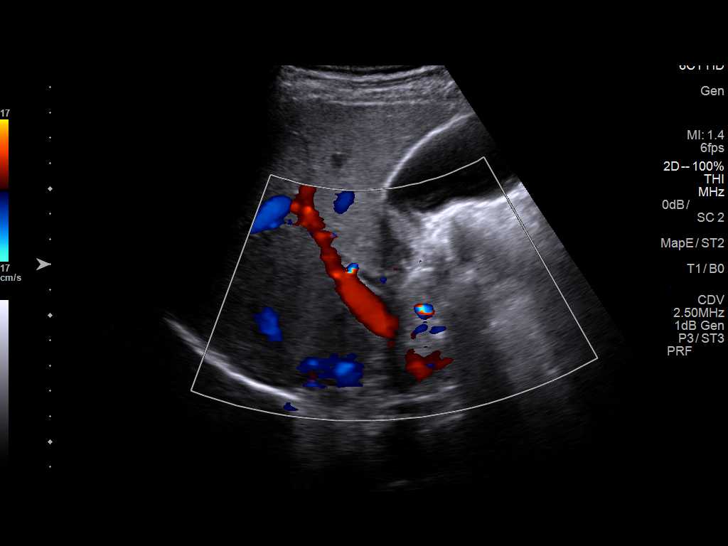

[14 of 25 positions shown; findings below may reference images not displayed]

FINDINGS: Gallbladder:

No gallstones or wall thickening visualized. No sonographic Murphy
sign noted by sonographer.

Common bile duct:

Diameter: 7 mm

Liver:

Mildly increased parenchymal echogenicity diffusely. 1.5 x 1.2 x
cm mildly hypoechoic focus in the right hepatic/caudate lobe. Portal
vein is patent on color Doppler imaging with normal direction of
blood flow towards the liver.
IMPRESSION: 1. Mildly echogenic liver, nonspecific but often seen with
steatosis.
2. 1.5 cm mildly hypoechoic focus in the right hepatic/caudate lobe,
indeterminate. Abdominal MRI (without and with contrast) is
recommended for further evaluation.
3. Borderline common bile duct dilatation.  Normal gallbladder.

## 2019-01-01 ENCOUNTER — Telehealth: Payer: Self-pay

## 2019-01-01 NOTE — Telephone Encounter (Signed)
[employer] mail order left v/m requesting cb to verify simvastatin 80 mg due to possible risk of myopathy. cb with ref # [id number].

## 2019-01-02 MED ORDER — SIMVASTATIN 80 MG PO TABS: 80.0000 mg | ORAL_TABLET | Freq: Every day | ORAL | 1 refills | Status: AC

## 2019-01-02 NOTE — Addendum Note (Signed)
Addended by: Lurlean Nanny on: 01/02/2019 12:26 PM   Modules accepted: Orders

## 2019-01-03 ENCOUNTER — Other Ambulatory Visit: Payer: Self-pay

## 2019-01-03 ENCOUNTER — Other Ambulatory Visit (INDEPENDENT_AMBULATORY_CARE_PROVIDER_SITE_OTHER): Payer: Medicare HMO

## 2019-01-03 DIAGNOSIS — E7800 Pure hypercholesterolemia, unspecified: Secondary | ICD-10-CM

## 2019-01-03 LAB — COMPREHENSIVE METABOLIC PANEL
ALK PHOS: 49 U/L (ref 39–117)
ALT: 32 U/L (ref 0–53)
AST: 21 U/L (ref 0–37)
Albumin: 3.9 g/dL (ref 3.5–5.2)
BUN: 15 mg/dL (ref 6–23)
CALCIUM: 8.5 mg/dL (ref 8.4–10.5)
CO2: 25 mEq/L (ref 19–32)
Chloride: 104 mEq/L (ref 96–112)
Creatinine, Ser: 0.95 mg/dL (ref 0.40–1.50)
GFR: 97.6 mL/min (ref >60.00)
Glucose, Bld: 98 mg/dL (ref 70–99)
Potassium: 3.9 mEq/L (ref 3.5–5.1)
Sodium: 137 mEq/L (ref 135–145)
Total Bilirubin: 0.4 mg/dL (ref 0.2–1.2)
Total Protein: 6.5 g/dL (ref 6.0–8.3)

## 2019-01-03 LAB — LIPID PANEL
Cholesterol: 205 mg/dL — ABNORMAL HIGH (ref 0–200)
HDL: 60.2 mg/dL (ref >39.00)
LDL Cholesterol: 126 mg/dL — ABNORMAL HIGH (ref 0–99)
NonHDL: 144.92
TRIGLYCERIDES: 95 mg/dL (ref 0.0–149.0)
Total CHOL/HDL Ratio: 3
VLDL: 19 mg/dL (ref 0.0–40.0)

## 2019-01-06 NOTE — Telephone Encounter (Signed)
Rx was resent with note that pt is currently taking that dose and has been for awhile. He is being monitored

## 2019-02-20 DIAGNOSIS — E7800 Pure hypercholesterolemia, unspecified: Secondary | ICD-10-CM | POA: Diagnosis not present

## 2019-02-20 DIAGNOSIS — Z1331 Encounter for screening for depression: Secondary | ICD-10-CM | POA: Diagnosis not present

## 2019-02-20 DIAGNOSIS — E663 Overweight: Secondary | ICD-10-CM | POA: Diagnosis not present

## 2019-02-20 DIAGNOSIS — M19049 Primary osteoarthritis, unspecified hand: Secondary | ICD-10-CM | POA: Diagnosis not present

## 2019-02-20 DIAGNOSIS — M549 Dorsalgia, unspecified: Secondary | ICD-10-CM | POA: Diagnosis not present

## 2019-02-20 DIAGNOSIS — R6882 Decreased libido: Secondary | ICD-10-CM | POA: Diagnosis not present

## 2019-02-20 DIAGNOSIS — N529 Male erectile dysfunction, unspecified: Secondary | ICD-10-CM | POA: Diagnosis not present

## 2019-08-27 DIAGNOSIS — H5203 Hypermetropia, bilateral: Secondary | ICD-10-CM | POA: Diagnosis not present

## 2019-09-16 DIAGNOSIS — Z1159 Encounter for screening for other viral diseases: Secondary | ICD-10-CM | POA: Diagnosis not present

## 2019-09-19 DIAGNOSIS — K5730 Diverticulosis of large intestine without perforation or abscess without bleeding: Secondary | ICD-10-CM | POA: Diagnosis not present

## 2019-09-19 DIAGNOSIS — K648 Other hemorrhoids: Secondary | ICD-10-CM | POA: Diagnosis not present

## 2019-09-19 DIAGNOSIS — D123 Benign neoplasm of transverse colon: Secondary | ICD-10-CM | POA: Diagnosis not present

## 2019-09-19 DIAGNOSIS — K635 Polyp of colon: Secondary | ICD-10-CM | POA: Diagnosis not present

## 2019-09-19 DIAGNOSIS — D120 Benign neoplasm of cecum: Secondary | ICD-10-CM | POA: Diagnosis not present

## 2019-09-19 DIAGNOSIS — Z1211 Encounter for screening for malignant neoplasm of colon: Secondary | ICD-10-CM | POA: Diagnosis not present

## 2019-09-23 DIAGNOSIS — K635 Polyp of colon: Secondary | ICD-10-CM | POA: Diagnosis not present

## 2019-09-23 DIAGNOSIS — D120 Benign neoplasm of cecum: Secondary | ICD-10-CM | POA: Diagnosis not present

## 2019-09-23 DIAGNOSIS — D123 Benign neoplasm of transverse colon: Secondary | ICD-10-CM | POA: Diagnosis not present

## 2019-09-25 DIAGNOSIS — Z125 Encounter for screening for malignant neoplasm of prostate: Secondary | ICD-10-CM | POA: Diagnosis not present

## 2019-09-25 DIAGNOSIS — E7800 Pure hypercholesterolemia, unspecified: Secondary | ICD-10-CM | POA: Diagnosis not present

## 2019-09-26 DIAGNOSIS — Z113 Encounter for screening for infections with a predominantly sexual mode of transmission: Secondary | ICD-10-CM | POA: Diagnosis not present

## 2019-09-26 DIAGNOSIS — E7800 Pure hypercholesterolemia, unspecified: Secondary | ICD-10-CM | POA: Diagnosis not present

## 2019-09-26 DIAGNOSIS — F411 Generalized anxiety disorder: Secondary | ICD-10-CM | POA: Diagnosis not present

## 2019-09-26 DIAGNOSIS — N529 Male erectile dysfunction, unspecified: Secondary | ICD-10-CM | POA: Diagnosis not present

## 2019-09-26 DIAGNOSIS — M549 Dorsalgia, unspecified: Secondary | ICD-10-CM | POA: Diagnosis not present

## 2019-09-26 DIAGNOSIS — Z1339 Encounter for screening examination for other mental health and behavioral disorders: Secondary | ICD-10-CM | POA: Diagnosis not present

## 2019-09-26 DIAGNOSIS — Z0000 Encounter for general adult medical examination without abnormal findings: Secondary | ICD-10-CM | POA: Diagnosis not present

## 2019-09-26 DIAGNOSIS — E46 Unspecified protein-calorie malnutrition: Secondary | ICD-10-CM | POA: Diagnosis not present

## 2019-09-26 DIAGNOSIS — M19049 Primary osteoarthritis, unspecified hand: Secondary | ICD-10-CM | POA: Diagnosis not present

## 2019-10-03 ENCOUNTER — Encounter: Payer: Medicare HMO | Admitting: Internal Medicine

## 2019-10-03 NOTE — Progress Notes (Deleted)
HPI:  Pt presents to the clinic today for his annual subsequent Medicare Wellness Exam. He is also due to follow up chronic conditions.  Chronic Low Back Pain: Managed with Ibuprofen.  HLD: His last LDL was 126, 12/2018. He denies myalgias on Simvastatin. He tries to consume a low fat diet.  ED: He has difficulty maintaining an erection. He takes Cialis as needed with good results.   Past Medical History:  Diagnosis Date  . Chronic back pain   . Hyperlipidemia     Current Outpatient Medications  Medication Sig Dispense Refill  . ibuprofen (ADVIL,MOTRIN) 600 MG tablet Take 1 tablet (600 mg total) by mouth as needed. 90 tablet 1  . simvastatin (ZOCOR) 80 MG tablet Take 1 tablet (80 mg total) by mouth daily. 90 tablet 1  . tadalafil (CIALIS) 20 MG tablet Take 20 mg by mouth as needed for erectile dysfunction.     No current facility-administered medications for this visit.    No Known Allergies  Family History  Problem Relation Age of Onset  . COPD Father   . Cancer Paternal Grandfather     Social History   Socioeconomic History  . Marital status: Married    Spouse name: Not on file  . Number of children: Not on file  . Years of education: Not on file  . Highest education level: Not on file  Occupational History  . Not on file  Tobacco Use  . Smoking status: Never Smoker  . Smokeless tobacco: Never Used  Substance and Sexual Activity  . Alcohol use: Yes    Alcohol/week: 0.0 standard drinks    Comment: social  . Drug use: No  . Sexual activity: Yes  Other Topics Concern  . Not on file  Social History Narrative  . Not on file   Social Determinants of Health   Financial Resource Strain:   . Difficulty of Paying Living Expenses: Not on file  Food Insecurity:   . Worried About Charity fundraiser in the Last Year: Not on file  . Ran Out of Food in the Last Year: Not on file  Transportation Needs:   . Lack of Transportation (Medical): Not on file  . Lack of  Transportation (Non-Medical): Not on file  Physical Activity:   . Days of Exercise per Week: Not on file  . Minutes of Exercise per Session: Not on file  Stress:   . Feeling of Stress : Not on file  Social Connections:   . Frequency of Communication with Friends and Family: Not on file  . Frequency of Social Gatherings with Friends and Family: Not on file  . Attends Religious Services: Not on file  . Active Member of Clubs or Organizations: Not on file  . Attends Archivist Meetings: Not on file  . Marital Status: Not on file  Intimate Partner Violence:   . Fear of Current or Ex-Partner: Not on file  . Emotionally Abused: Not on file  . Physically Abused: Not on file  . Sexually Abused: Not on file    Hospitiliaztions: None  Health Maintenance:    Flu: 07/2018  Tetanus: 11/2012  Zostavax: never  Shingrix: never  PSA: 09/2018  Colon Screening:  Eye Doctor:  Dental Exam:   Providers:   PCP: Webb Silversmith, NP    I have personally reviewed and have noted:  1. The patient's medical and social history 2. Their use of alcohol, tobacco or illicit drugs 3. Their current medications and supplements  4. The patient's functional ability including ADL's, fall risks, home safety risks and hearing or visual impairment. 5. Diet and physical activities 6. Evidence for depression or mood disorder  Subjective:   Review of Systems:   Constitutional: Denies fever, malaise, fatigue, headache or abrupt weight changes.  HEENT: Denies eye pain, eye redness, ear pain, ringing in the ears, wax buildup, runny nose, nasal congestion, bloody nose, or sore throat. Respiratory: Denies difficulty breathing, shortness of breath, cough or sputum production.   Cardiovascular: Denies chest pain, chest tightness, palpitations or swelling in the hands or feet.  Gastrointestinal: Denies abdominal pain, bloating, constipation, diarrhea or blood in the stool.  GU: Denies urgency, frequency,  pain with urination, burning sensation, blood in urine, odor or discharge. Musculoskeletal: Denies decrease in range of motion, difficulty with gait, muscle pain or joint pain and swelling.  Skin: Denies redness, rashes, lesions or ulcercations.  Neurological: Denies dizziness, difficulty with memory, difficulty with speech or problems with balance and coordination.  Psych: Denies anxiety, depression, SI/HI.  No other specific complaints in a complete review of systems (except as listed in HPI above).  Objective:  PE:   Wt Readings from Last 3 Encounters:  11/27/18 204 lb 4 oz (92.6 kg)  09/27/18 203 lb (92.1 kg)  03/22/18 197 lb (89.4 kg)    General: Appears their stated age, well developed, well nourished in NAD. Skin: Warm, dry and intact. No rashes, lesions or ulcerations noted. HEENT: Head: normal shape and size; Eyes: sclera white, no icterus, conjunctiva pink, PERRLA and EOMs intact; Ears: Tm's gray and intact, normal light reflex; Throat/Mouth: Teeth present, mucosa pink and moist, no exudate, lesions or ulcerations noted.  Neck: Neck supple, trachea midline. No masses, lumps or thyromegaly present.  Cardiovascular: Normal rate and rhythm. S1,S2 noted.  No murmur, rubs or gallops noted. No JVD or BLE edema. No carotid bruits noted. Pulmonary/Chest: Normal effort and positive vesicular breath sounds. No respiratory distress. No wheezes, rales or ronchi noted.  Abdomen: Soft and nontender. Normal bowel sounds. No distention or masses noted. Liver, spleen and kidneys non palpable. Musculoskeletal: Normal range of motion. Strength 5/5 BUE/BLE. No signs of joint swelling.  Neurological: Alert and oriented. Cranial nerves II-XII grossly intact. Coordination normal.  Psychiatric: Mood and affect normal. Behavior is normal. Judgment and thought content normal.   EKG:  BMET    Component Value Date/Time   NA 137 01/03/2019 0850   K 3.9 01/03/2019 0850   CL 104 01/03/2019 0850   CO2  25 01/03/2019 0850   GLUCOSE 98 01/03/2019 0850   BUN 15 01/03/2019 0850   CREATININE 0.95 01/03/2019 0850   CREATININE 0.88 09/27/2018 1448   CALCIUM 8.5 01/03/2019 0850    Lipid Panel     Component Value Date/Time   CHOL 205 (H) 01/03/2019 0850   TRIG 95.0 01/03/2019 0850   HDL 60.20 01/03/2019 0850   CHOLHDL 3 01/03/2019 0850   VLDL 19.0 01/03/2019 0850   LDLCALC 126 (H) 01/03/2019 0850   LDLCALC 164 (H) 09/27/2018 1448    CBC    Component Value Date/Time   WBC 4.6 09/27/2018 1448   RBC 4.75 09/27/2018 1448   HGB 13.5 09/27/2018 1448   HCT 40.8 09/27/2018 1448   PLT 231 09/27/2018 1448   MCV 85.9 09/27/2018 1448   MCH 28.4 09/27/2018 1448   MCHC 33.1 09/27/2018 1448   RDW 13.1 09/27/2018 1448    Hgb A1C No results found for: HGBA1C    Assessment  and Plan:   Medicare Annual Wellness Visit:  Diet:  Physical activity:  Depression/mood screen: Negative, PHQ 9 score of 0 Hearing: Intact to whispered voice Visual acuity: Grossly normal, performs annual eye exam  ADLs: Capable Fall risk: None Home safety: Good Cognitive evaluation: Intact to orientation, naming, recall and repetition EOL planning: Adv directives, full code/ I agree  Preventative Medicine:   Next appointment:   Webb Silversmith, NP

## 2019-12-18 IMAGING — DX DG CHEST 2V
2 series · 2 of 2 positions shown · non-contrast
Comparison: November 09, 2017

CLINICAL DATA: Shortness of breath and fever

EXAM:
CHEST - 2 VIEW

[chest pa]
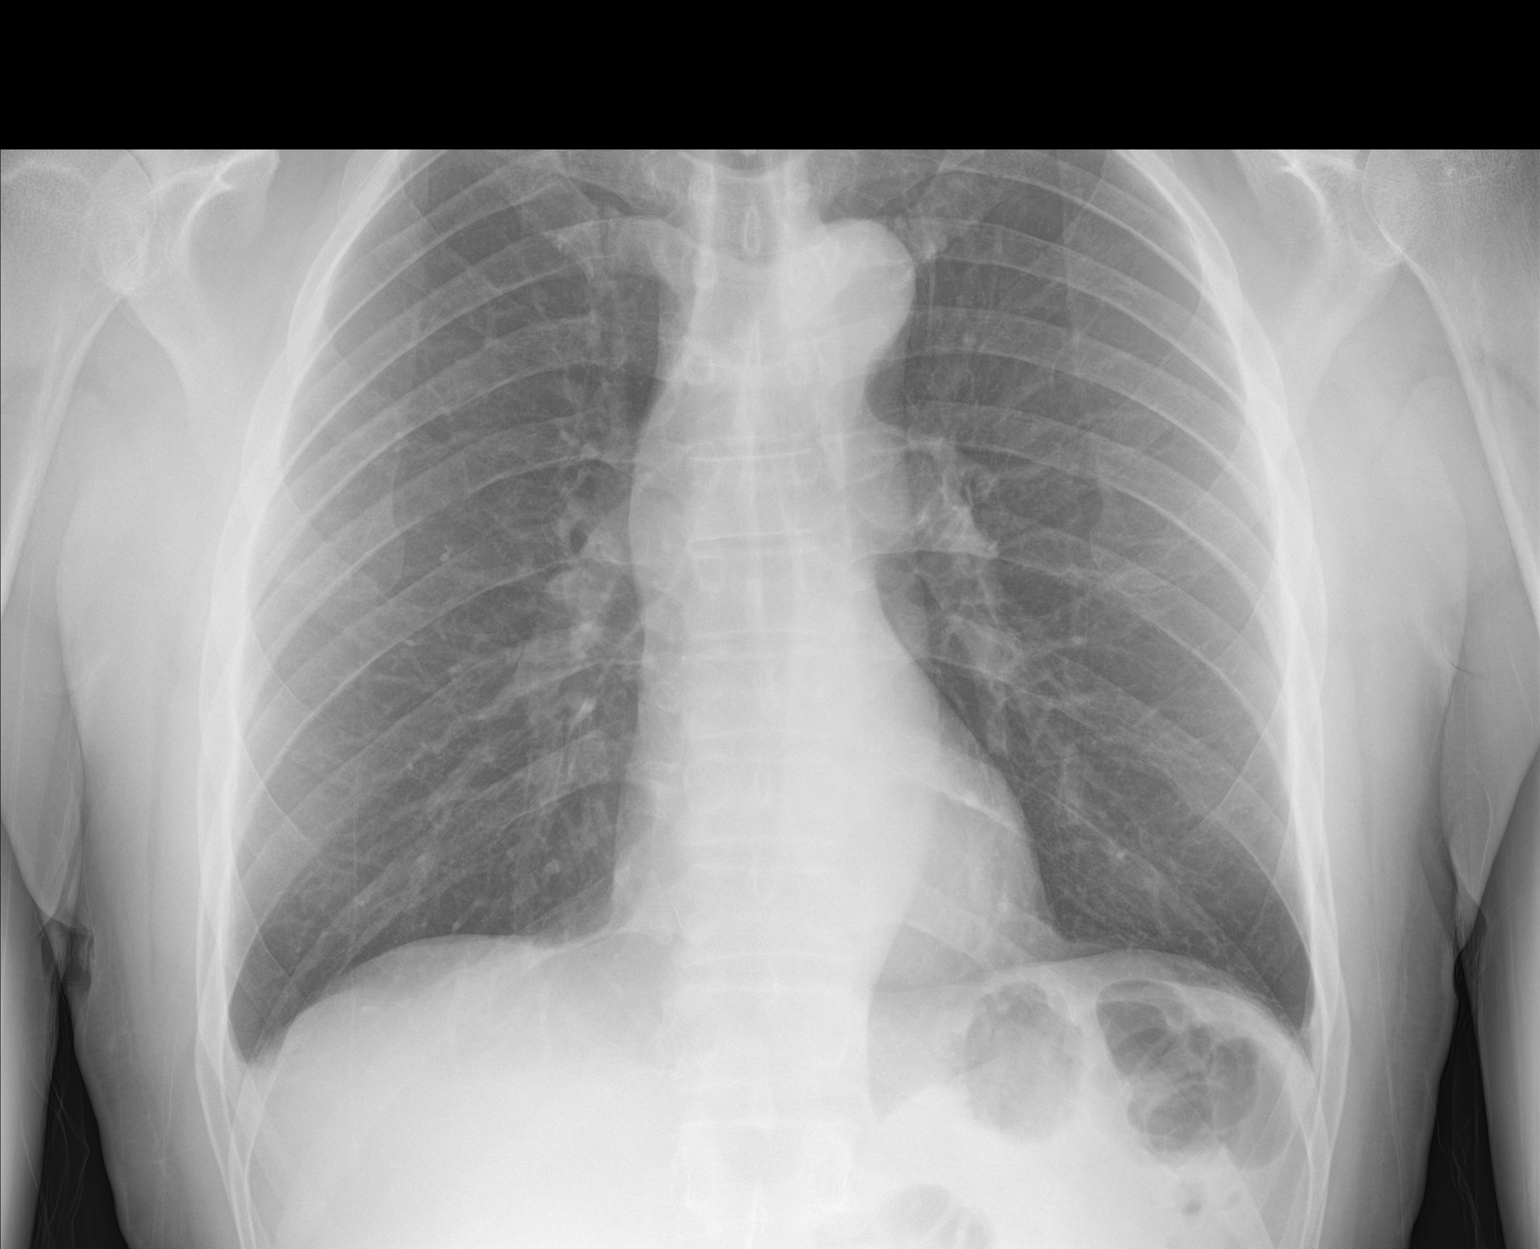

[chest lat]
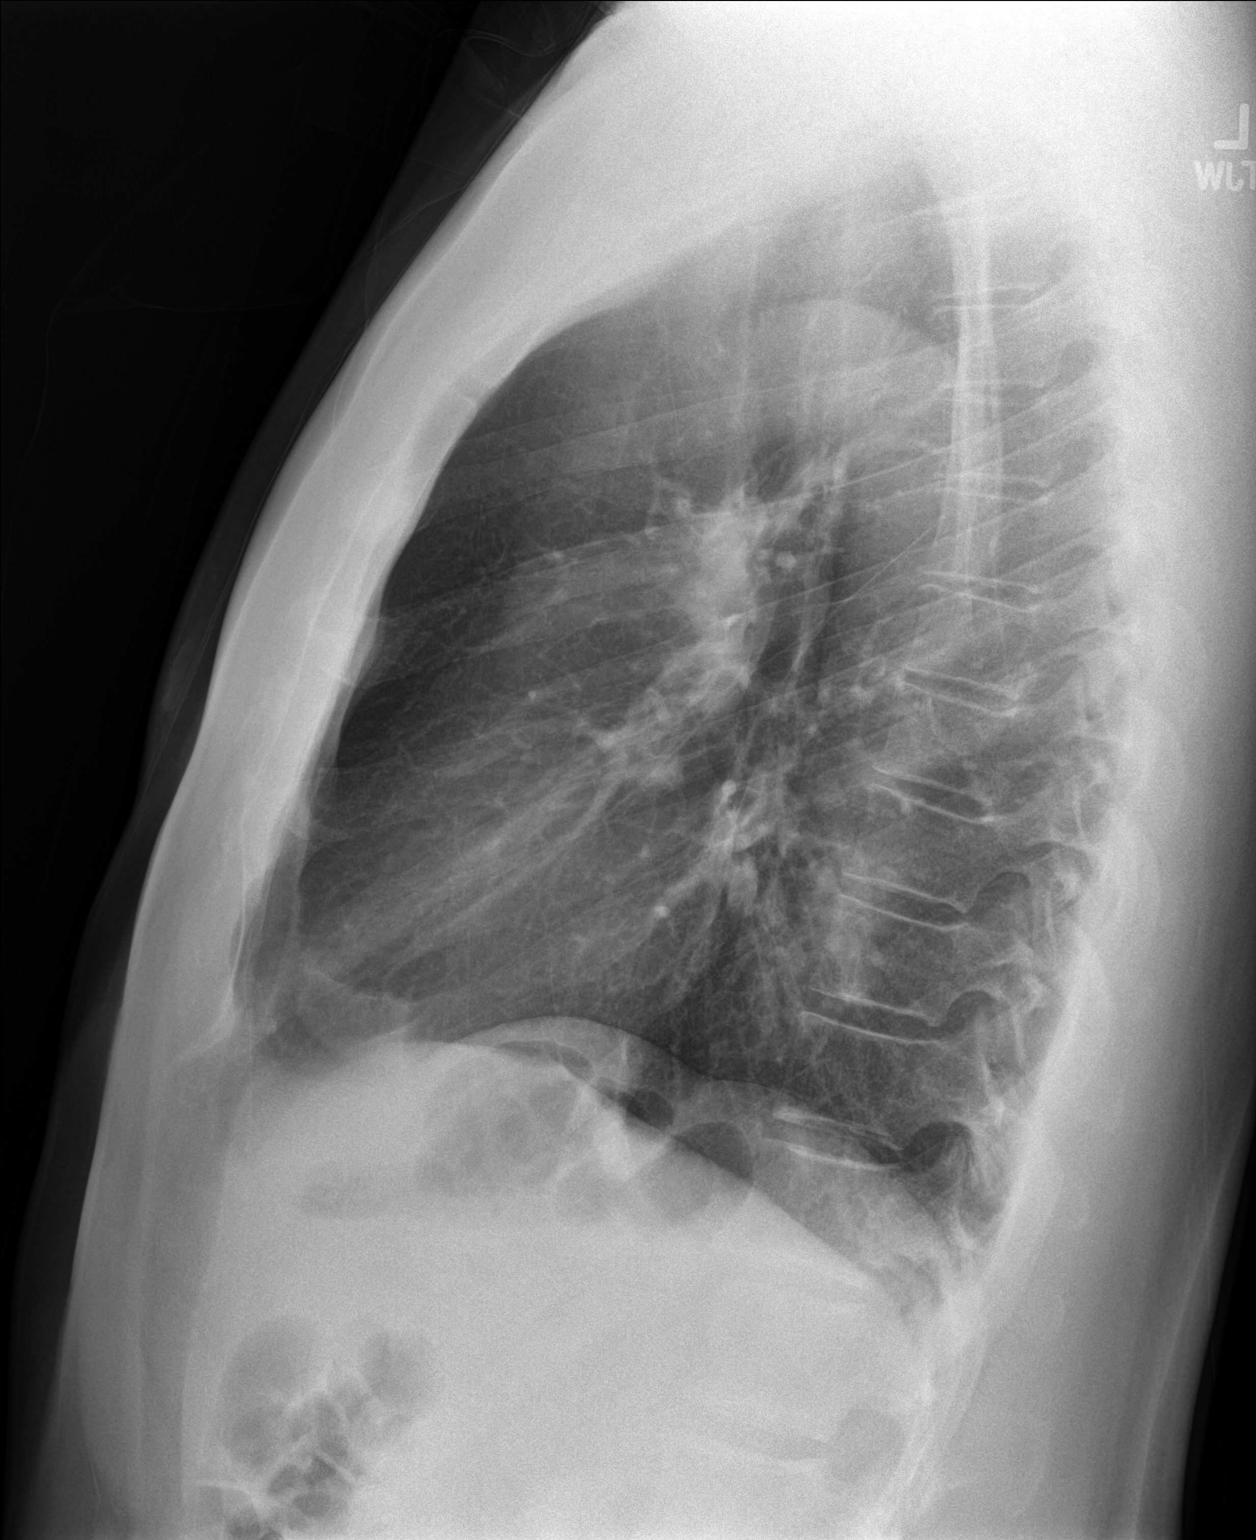

[2 of 2 positions shown; findings below may reference images not displayed]

FINDINGS: There is no appreciable edema or consolidation. The heart size and
pulmonary vascularity are normal. No adenopathy. No bone lesions.
IMPRESSION: No edema or consolidation.

## 2020-06-17 ENCOUNTER — Telehealth: Payer: Self-pay

## 2020-06-17 NOTE — Telephone Encounter (Signed)
Called patient to do reminder call for appt on 06/18/2020. Patient stated that he no longer see's the provider. Its been over a year since he was last in office and no longer needed the appt

## 2020-06-17 NOTE — Telephone Encounter (Signed)
noted 

## 2020-06-18 ENCOUNTER — Encounter: Payer: Medicare HMO | Admitting: Internal Medicine

## 2020-07-02 DIAGNOSIS — N529 Male erectile dysfunction, unspecified: Secondary | ICD-10-CM | POA: Diagnosis not present

## 2020-07-02 DIAGNOSIS — Z0000 Encounter for general adult medical examination without abnormal findings: Secondary | ICD-10-CM | POA: Diagnosis not present

## 2020-07-02 DIAGNOSIS — L282 Other prurigo: Secondary | ICD-10-CM | POA: Diagnosis not present

## 2020-07-02 DIAGNOSIS — E7800 Pure hypercholesterolemia, unspecified: Secondary | ICD-10-CM | POA: Diagnosis not present

## 2020-07-02 DIAGNOSIS — Z125 Encounter for screening for malignant neoplasm of prostate: Secondary | ICD-10-CM | POA: Diagnosis not present

## 2020-07-02 DIAGNOSIS — Z23 Encounter for immunization: Secondary | ICD-10-CM | POA: Diagnosis not present

## 2021-05-11 DIAGNOSIS — Z87891 Personal history of nicotine dependence: Secondary | ICD-10-CM | POA: Diagnosis not present

## 2021-05-11 DIAGNOSIS — R509 Fever, unspecified: Secondary | ICD-10-CM | POA: Diagnosis not present

## 2021-05-11 DIAGNOSIS — R110 Nausea: Secondary | ICD-10-CM | POA: Diagnosis not present

## 2021-05-11 DIAGNOSIS — U071 COVID-19: Secondary | ICD-10-CM | POA: Diagnosis not present

## 2021-05-11 DIAGNOSIS — L509 Urticaria, unspecified: Secondary | ICD-10-CM | POA: Diagnosis not present

## 2021-05-11 DIAGNOSIS — R059 Cough, unspecified: Secondary | ICD-10-CM | POA: Diagnosis not present

## 2021-05-11 DIAGNOSIS — R0981 Nasal congestion: Secondary | ICD-10-CM | POA: Diagnosis not present

## 2021-05-11 DIAGNOSIS — R519 Headache, unspecified: Secondary | ICD-10-CM | POA: Diagnosis not present

## 2021-07-01 DIAGNOSIS — Z0001 Encounter for general adult medical examination with abnormal findings: Secondary | ICD-10-CM | POA: Diagnosis not present

## 2021-07-01 DIAGNOSIS — N529 Male erectile dysfunction, unspecified: Secondary | ICD-10-CM | POA: Diagnosis not present

## 2021-07-01 DIAGNOSIS — Z0000 Encounter for general adult medical examination without abnormal findings: Secondary | ICD-10-CM | POA: Diagnosis not present

## 2021-07-01 DIAGNOSIS — R252 Cramp and spasm: Secondary | ICD-10-CM | POA: Diagnosis not present

## 2021-07-01 DIAGNOSIS — Z125 Encounter for screening for malignant neoplasm of prostate: Secondary | ICD-10-CM | POA: Diagnosis not present

## 2021-07-01 DIAGNOSIS — Z809 Family history of malignant neoplasm, unspecified: Secondary | ICD-10-CM | POA: Diagnosis not present

## 2021-07-01 DIAGNOSIS — E7800 Pure hypercholesterolemia, unspecified: Secondary | ICD-10-CM | POA: Diagnosis not present

## 2021-07-01 DIAGNOSIS — R6882 Decreased libido: Secondary | ICD-10-CM | POA: Diagnosis not present

## 2021-07-01 DIAGNOSIS — Z79899 Other long term (current) drug therapy: Secondary | ICD-10-CM | POA: Diagnosis not present

## 2021-07-01 DIAGNOSIS — R0989 Other specified symptoms and signs involving the circulatory and respiratory systems: Secondary | ICD-10-CM | POA: Diagnosis not present

## 2021-07-01 DIAGNOSIS — Z8249 Family history of ischemic heart disease and other diseases of the circulatory system: Secondary | ICD-10-CM | POA: Diagnosis not present

## 2021-07-01 DIAGNOSIS — R69 Illness, unspecified: Secondary | ICD-10-CM | POA: Diagnosis not present

## 2021-07-08 DIAGNOSIS — H524 Presbyopia: Secondary | ICD-10-CM | POA: Diagnosis not present

## 2021-07-08 DIAGNOSIS — H2513 Age-related nuclear cataract, bilateral: Secondary | ICD-10-CM | POA: Diagnosis not present

## 2021-07-29 DIAGNOSIS — Z23 Encounter for immunization: Secondary | ICD-10-CM | POA: Diagnosis not present

## 2021-07-29 DIAGNOSIS — R0989 Other specified symptoms and signs involving the circulatory and respiratory systems: Secondary | ICD-10-CM | POA: Diagnosis not present

## 2021-07-29 DIAGNOSIS — H9312 Tinnitus, left ear: Secondary | ICD-10-CM | POA: Diagnosis not present

## 2021-07-29 DIAGNOSIS — E291 Testicular hypofunction: Secondary | ICD-10-CM | POA: Diagnosis not present

## 2021-08-26 DIAGNOSIS — R69 Illness, unspecified: Secondary | ICD-10-CM | POA: Diagnosis not present

## 2021-08-26 DIAGNOSIS — R6889 Other general symptoms and signs: Secondary | ICD-10-CM | POA: Diagnosis not present

## 2021-08-26 DIAGNOSIS — E785 Hyperlipidemia, unspecified: Secondary | ICD-10-CM | POA: Diagnosis not present

## 2021-08-26 DIAGNOSIS — R252 Cramp and spasm: Secondary | ICD-10-CM | POA: Diagnosis not present

## 2021-08-26 DIAGNOSIS — R7989 Other specified abnormal findings of blood chemistry: Secondary | ICD-10-CM | POA: Diagnosis not present

## 2021-08-26 DIAGNOSIS — R058 Other specified cough: Secondary | ICD-10-CM | POA: Diagnosis not present

## 2021-08-26 DIAGNOSIS — N529 Male erectile dysfunction, unspecified: Secondary | ICD-10-CM | POA: Diagnosis not present

## 2021-08-26 DIAGNOSIS — Z86010 Personal history of colonic polyps: Secondary | ICD-10-CM | POA: Diagnosis not present

## 2021-08-26 DIAGNOSIS — Z8616 Personal history of COVID-19: Secondary | ICD-10-CM | POA: Diagnosis not present

## 2021-08-26 DIAGNOSIS — E7800 Pure hypercholesterolemia, unspecified: Secondary | ICD-10-CM | POA: Diagnosis not present

## 2021-08-26 DIAGNOSIS — Z79899 Other long term (current) drug therapy: Secondary | ICD-10-CM | POA: Diagnosis not present

## 2021-09-23 DIAGNOSIS — H93292 Other abnormal auditory perceptions, left ear: Secondary | ICD-10-CM | POA: Diagnosis not present

## 2022-08-11 DIAGNOSIS — N529 Male erectile dysfunction, unspecified: Secondary | ICD-10-CM | POA: Diagnosis not present

## 2022-08-11 DIAGNOSIS — R7989 Other specified abnormal findings of blood chemistry: Secondary | ICD-10-CM | POA: Diagnosis not present
